# Patient Record
Sex: Female | Born: 1980 | Race: White | Hispanic: No | Marital: Married | Smoking: Former smoker
Health system: Southern US, Community
[De-identification: ages and names within clinical notes are randomized; demographics above are authoritative.]

## PROBLEM LIST (undated history)

## (undated) DIAGNOSIS — R002 Palpitations: Secondary | ICD-10-CM

## (undated) DIAGNOSIS — Z9889 Other specified postprocedural states: Secondary | ICD-10-CM

## (undated) DIAGNOSIS — I10 Essential (primary) hypertension: Secondary | ICD-10-CM

## (undated) DIAGNOSIS — D649 Anemia, unspecified: Secondary | ICD-10-CM

## (undated) DIAGNOSIS — O99210 Obesity complicating pregnancy, unspecified trimester: Secondary | ICD-10-CM

## (undated) HISTORY — PX: LAPAROSCOPIC GASTRIC SLEEVE RESECTION: SHX5895

## (undated) HISTORY — PX: CHOLECYSTECTOMY: SHX55

## (undated) MED FILL — Iron Sucrose Inj 20 MG/ML (Fe Equiv): INTRAVENOUS | Qty: 10 | Status: AC

---

## 2014-06-03 ENCOUNTER — Inpatient Hospital Stay (HOSPITAL_COMMUNITY): Payer: BLUE CROSS/BLUE SHIELD | Admitting: Anesthesiology

## 2014-06-03 ENCOUNTER — Observation Stay (HOSPITAL_COMMUNITY)
Admission: EM | Admit: 2014-06-03 | Discharge: 2014-06-03 | Disposition: A | Discharge status: Home / Self Care | Payer: BLUE CROSS/BLUE SHIELD | Attending: Obstetrics & Gynecology | Admitting: Obstetrics & Gynecology

## 2014-06-03 ENCOUNTER — Inpatient Hospital Stay (HOSPITAL_COMMUNITY): Payer: BLUE CROSS/BLUE SHIELD

## 2014-06-03 ENCOUNTER — Encounter (HOSPITAL_COMMUNITY): Payer: Self-pay

## 2014-06-03 ENCOUNTER — Encounter (HOSPITAL_COMMUNITY)
Admission: EM | Disposition: A | Discharge status: Home / Self Care | Payer: Self-pay | Source: Home / Self Care | Attending: Obstetrics & Gynecology

## 2014-06-03 ENCOUNTER — Encounter: Payer: Self-pay | Admitting: Obstetrics & Gynecology

## 2014-06-03 DIAGNOSIS — Z6841 Body Mass Index (BMI) 40.0 and over, adult: Secondary | ICD-10-CM | POA: Insufficient documentation

## 2014-06-03 DIAGNOSIS — O42912 Preterm premature rupture of membranes, unspecified as to length of time between rupture and onset of labor, second trimester: Secondary | ICD-10-CM

## 2014-06-03 DIAGNOSIS — I10 Essential (primary) hypertension: Secondary | ICD-10-CM | POA: Insufficient documentation

## 2014-06-03 DIAGNOSIS — Z3A17 17 weeks gestation of pregnancy: Secondary | ICD-10-CM | POA: Insufficient documentation

## 2014-06-03 DIAGNOSIS — Z87891 Personal history of nicotine dependence: Secondary | ICD-10-CM | POA: Insufficient documentation

## 2014-06-03 HISTORY — DX: Essential (primary) hypertension: I10

## 2014-06-03 HISTORY — PX: DILATION AND EVACUATION: SHX1459

## 2014-06-03 LAB — CBC
HCT: 39.3 % (ref 36.0–46.0)
Hemoglobin: 14 g/dL (ref 12.0–15.0)
MCH: 30 pg (ref 26.0–34.0)
MCHC: 35.6 g/dL (ref 30.0–36.0)
MCV: 84.3 fL (ref 78.0–100.0)
Platelets: 355 10*3/uL (ref 150–400)
RBC: 4.66 MIL/uL (ref 3.87–5.11)
RDW: 14.2 % (ref 11.5–15.5)
WBC: 14.6 10*3/uL — ABNORMAL HIGH (ref 4.0–10.5)

## 2014-06-03 LAB — CBC WITH DIFFERENTIAL/PLATELET
Basophils Absolute: 0 10*3/uL (ref 0.0–0.1)
Basophils Relative: 0 % (ref 0–1)
Eosinophils Absolute: 0.1 10*3/uL (ref 0.0–0.7)
Eosinophils Relative: 1 % (ref 0–5)
HCT: 37.7 % (ref 36.0–46.0)
Hemoglobin: 13.3 g/dL (ref 12.0–15.0)
Lymphocytes Relative: 17 % (ref 12–46)
Lymphs Abs: 2.1 10*3/uL (ref 0.7–4.0)
MCH: 29.8 pg (ref 26.0–34.0)
MCHC: 35.3 g/dL (ref 30.0–36.0)
MCV: 84.3 fL (ref 78.0–100.0)
Monocytes Absolute: 1.1 10*3/uL — ABNORMAL HIGH (ref 0.1–1.0)
Monocytes Relative: 8 % (ref 3–12)
Neutro Abs: 9.4 10*3/uL — ABNORMAL HIGH (ref 1.7–7.7)
Neutrophils Relative %: 74 % (ref 43–77)
Platelets: 334 10*3/uL (ref 150–400)
RBC: 4.47 MIL/uL (ref 3.87–5.11)
RDW: 14 % (ref 11.5–15.5)
WBC: 12.6 10*3/uL — ABNORMAL HIGH (ref 4.0–10.5)

## 2014-06-03 LAB — BASIC METABOLIC PANEL
Anion gap: 4 — ABNORMAL LOW (ref 5–15)
BUN: <5 mg/dL — ABNORMAL LOW (ref 6–23)
CO2: 26 mmol/L (ref 19–32)
Calcium: 8.8 mg/dL (ref 8.4–10.5)
Chloride: 106 mmol/L (ref 96–112)
Creatinine, Ser: 0.68 mg/dL (ref 0.50–1.10)
GFR calc Af Amer: >90 mL/min (ref >90)
GFR calc non Af Amer: >90 mL/min (ref >90)
Glucose, Bld: 109 mg/dL — ABNORMAL HIGH (ref 70–99)
Potassium: 3.5 mmol/L (ref 3.5–5.1)
Sodium: 136 mmol/L (ref 135–145)

## 2014-06-03 LAB — TYPE AND SCREEN
ABO/RH(D): B POS
Antibody Screen: NEGATIVE

## 2014-06-03 LAB — HCG, QUANTITATIVE, PREGNANCY: hCG, Beta Chain, Quant, S: 23995 m[IU]/mL — ABNORMAL HIGH (ref <5)

## 2014-06-03 LAB — MRSA PCR SCREENING: MRSA by PCR: NEGATIVE

## 2014-06-03 LAB — ABO/RH: ABO/RH(D): B POS

## 2014-06-03 SURGERY — DILATION AND EVACUATION, UTERUS
Anesthesia: Monitor Anesthesia Care | Site: Vagina

## 2014-06-03 MED ORDER — FAMOTIDINE IN NACL 20-0.9 MG/50ML-% IV SOLN
20.0000 mg | Freq: Once | INTRAVENOUS | Status: AC
  Administered 2014-06-03: 20 mg via INTRAVENOUS
  Filled 2014-06-03: qty 50

## 2014-06-03 MED ORDER — IBUPROFEN 600 MG PO TABS: 600.0000 mg | ORAL_TABLET | Freq: Four times a day (QID) | ORAL | Status: DC | PRN

## 2014-06-03 MED ORDER — LACTATED RINGERS IV SOLN
INTRAVENOUS | Status: DC
  Administered 2014-06-03 (×3): via INTRAVENOUS

## 2014-06-03 MED ORDER — KETOROLAC TROMETHAMINE 30 MG/ML IJ SOLN
INTRAMUSCULAR | Status: AC
  Filled 2014-06-03: qty 1

## 2014-06-03 MED ORDER — FENTANYL CITRATE 0.05 MG/ML IJ SOLN
INTRAMUSCULAR | Status: AC
  Filled 2014-06-03: qty 5

## 2014-06-03 MED ORDER — MIDAZOLAM HCL 2 MG/2ML IJ SOLN
INTRAMUSCULAR | Status: AC
  Filled 2014-06-03: qty 2

## 2014-06-03 MED ORDER — OXYCODONE-ACETAMINOPHEN 5-325 MG PO TABS: 1.0000 | ORAL_TABLET | ORAL | Status: DC | PRN

## 2014-06-03 MED ORDER — DEXAMETHASONE SODIUM PHOSPHATE 4 MG/ML IJ SOLN
INTRAMUSCULAR | Status: AC
Start: 2014-06-03 — End: 2014-06-03
  Filled 2014-06-03: qty 1

## 2014-06-03 MED ORDER — LIDOCAINE HCL (CARDIAC) 20 MG/ML IV SOLN
INTRAVENOUS | Status: DC | PRN
  Administered 2014-06-03: 50 mg via INTRAVENOUS

## 2014-06-03 MED ORDER — CITRIC ACID-SODIUM CITRATE 334-500 MG/5ML PO SOLN
30.0000 mL | Freq: Once | ORAL | Status: AC
  Administered 2014-06-03: 30 mL via ORAL
  Filled 2014-06-03: qty 30

## 2014-06-03 MED ORDER — MIDAZOLAM HCL 2 MG/2ML IJ SOLN
INTRAMUSCULAR | Status: DC | PRN
  Administered 2014-06-03: 2 mg via INTRAVENOUS

## 2014-06-03 MED ORDER — ONDANSETRON HCL 4 MG PO TABS: 4.0000 mg | ORAL_TABLET | Freq: Four times a day (QID) | ORAL | Status: DC | PRN

## 2014-06-03 MED ORDER — PROPOFOL 10 MG/ML IV BOLUS
INTRAVENOUS | Status: AC
  Filled 2014-06-03: qty 20

## 2014-06-03 MED ORDER — LIDOCAINE HCL (CARDIAC) 20 MG/ML IV SOLN
INTRAVENOUS | Status: AC
  Filled 2014-06-03: qty 5

## 2014-06-03 MED ORDER — ACETAMINOPHEN 500 MG PO TABS: 1000.0000 mg | ORAL_TABLET | Freq: Four times a day (QID) | ORAL | Status: DC | PRN

## 2014-06-03 MED ORDER — KETOROLAC TROMETHAMINE 30 MG/ML IJ SOLN
INTRAMUSCULAR | Status: DC | PRN
  Administered 2014-06-03: 30 mg via INTRAVENOUS

## 2014-06-03 MED ORDER — HYDROMORPHONE HCL 1 MG/ML IJ SOLN: 0.2000 mg | INTRAMUSCULAR | Status: DC | PRN

## 2014-06-03 MED ORDER — DOCUSATE SODIUM 100 MG PO CAPS: 100.0000 mg | ORAL_CAPSULE | Freq: Two times a day (BID) | ORAL | Status: DC | PRN

## 2014-06-03 MED ORDER — MISOPROSTOL 200 MCG PO TABS
ORAL_TABLET | ORAL | Status: AC
  Filled 2014-06-03: qty 3

## 2014-06-03 MED ORDER — ONDANSETRON HCL 4 MG/2ML IJ SOLN: 4.0000 mg | Freq: Four times a day (QID) | INTRAMUSCULAR | Status: DC | PRN

## 2014-06-03 MED ORDER — FENTANYL CITRATE 0.05 MG/ML IJ SOLN: 25.0000 ug | INTRAMUSCULAR | Status: DC | PRN

## 2014-06-03 MED ORDER — PROPOFOL 10 MG/ML IV BOLUS
INTRAVENOUS | Status: DC | PRN
  Administered 2014-06-03: 200 mg via INTRAVENOUS

## 2014-06-03 MED ORDER — ONDANSETRON HCL 4 MG/2ML IJ SOLN
INTRAMUSCULAR | Status: AC
  Filled 2014-06-03: qty 2

## 2014-06-03 MED ORDER — BUPIVACAINE HCL 0.5 % IJ SOLN
INTRAMUSCULAR | Status: DC | PRN
  Administered 2014-06-03: 30 mL

## 2014-06-03 MED ORDER — BUPIVACAINE HCL (PF) 0.5 % IJ SOLN
INTRAMUSCULAR | Status: AC
  Filled 2014-06-03: qty 30

## 2014-06-03 MED ORDER — DEXAMETHASONE SODIUM PHOSPHATE 10 MG/ML IJ SOLN
INTRAMUSCULAR | Status: DC | PRN
  Administered 2014-06-03: 10 mg via INTRAVENOUS

## 2014-06-03 MED ORDER — DOXYCYCLINE HYCLATE 100 MG IV SOLR
200.0000 mg | INTRAVENOUS | Status: AC
  Administered 2014-06-03: 200 mg via INTRAVENOUS
  Filled 2014-06-03: qty 200

## 2014-06-03 MED ORDER — FENTANYL CITRATE 0.05 MG/ML IJ SOLN
INTRAMUSCULAR | Status: DC | PRN
  Administered 2014-06-03: 100 ug via INTRAVENOUS

## 2014-06-03 MED ORDER — ONDANSETRON HCL 4 MG/2ML IJ SOLN
INTRAMUSCULAR | Status: DC | PRN
  Administered 2014-06-03: 4 mg via INTRAVENOUS

## 2014-06-03 SURGICAL SUPPLY — 19 items
CATH ROBINSON RED A/P 16FR (CATHETERS) ×2 IMPLANT
CLOTH BEACON ORANGE TIMEOUT ST (SAFETY) ×2 IMPLANT
DECANTER SPIKE VIAL GLASS SM (MISCELLANEOUS) ×2 IMPLANT
GLOVE BIOGEL PI IND STRL 7.0 (GLOVE) ×2 IMPLANT
GLOVE BIOGEL PI INDICATOR 7.0 (GLOVE) ×2
GLOVE ECLIPSE 7.0 STRL STRAW (GLOVE) ×2 IMPLANT
GOWN STRL REUS W/TWL LRG LVL3 (GOWN DISPOSABLE) ×4 IMPLANT
KIT BERKELEY 1ST TRIMESTER 3/8 (MISCELLANEOUS) ×2 IMPLANT
NS IRRIG 1000ML POUR BTL (IV SOLUTION) ×2 IMPLANT
PACK VAGINAL MINOR WOMEN LF (CUSTOM PROCEDURE TRAY) ×2 IMPLANT
PAD OB MATERNITY 4.3X12.25 (PERSONAL CARE ITEMS) ×2 IMPLANT
PAD PREP 24X48 CUFFED NSTRL (MISCELLANEOUS) ×2 IMPLANT
SET BERKELEY SUCTION TUBING (SUCTIONS) ×2 IMPLANT
TOWEL OR 17X24 6PK STRL BLUE (TOWEL DISPOSABLE) ×4 IMPLANT
VACURETTE 10 RIGID CVD (CANNULA) IMPLANT
VACURETTE 6 ASPIR F TIP BERK (CANNULA) IMPLANT
VACURETTE 7MM CVD STRL WRAP (CANNULA) IMPLANT
VACURETTE 8 RIGID CVD (CANNULA) IMPLANT
VACURETTE 9 RIGID CVD (CANNULA) IMPLANT

## 2014-06-03 NOTE — Interval H&P Note (Signed)
History and Physical Interval Note  Patient has now decided to proceed with Dilation and Evacuation (D&E) for retained placenta; to be done under ultrasound guidance.  No bleeding noted. Still afebrile, other VSS.   Risks of surgery including bleeding, infection, injury to surrounding organs, need for additional procedures, possibility of intrauterine scarring which may impair future fertility, risk of retained products which may require further management and other postoperative/anesthesia complications were explained to patient.  Likelihood of success of complete evacuation of the uterus was discussed with the patient.  Written informed consent was obtained.  Patient has been NPO since last night  she will remain NPO for procedure. Anesthesia and OR aware.  Preoperative prophylactic Doxycycline 200mg  IV  has been ordered and is on call to the OR.  To OR when ready.    Amber CollinsUGONNA  Amber Riveron, MD, FACOG Attending Obstetrician & Gynecologist Faculty Practice, [facility] [place]

## 2014-06-03 NOTE — Anesthesia Preprocedure Evaluation (Signed)
Anesthesia Evaluation  Patient identified by MRN, date of birth, ID band Patient awake    Reviewed: Allergy & Precautions, H&P , Patient's Chart, lab work & pertinent test results, reviewed documented beta blocker date and time   Airway Mallampati: II  TM Distance: >3 FB Neck ROM: full    Dental no notable dental hx.    Pulmonary former smoker,  breath sounds clear to auscultation  Pulmonary exam normal       Cardiovascular hypertension, On Medications Rhythm:regular Rate:Normal     Neuro/Psych    GI/Hepatic   Endo/Other  Morbid obesity  Renal/GU      Musculoskeletal   Abdominal   Peds  Hematology   Anesthesia Other Findings   Reproductive/Obstetrics                             Anesthesia Physical Anesthesia Plan  ASA: III  Anesthesia Plan:    Post-op Pain Management:    Induction: Intravenous  Airway Management Planned: LMA  Additional Equipment:   Intra-op Plan:   Post-operative Plan:   Informed Consent: I have reviewed the patients History and Physical, chart, labs and discussed the procedure including the risks, benefits and alternatives for the proposed anesthesia with the patient or authorized representative who has indicated his/her understanding and acceptance.   Dental Advisory Given and Dental advisory given  Plan Discussed with: CRNA and Surgeon  Anesthesia Plan Comments: (Discussed GA with LMA, possible sore throat, potential need to switch to ETT, N/V, pulmonary aspiration. Questions answered. )        Anesthesia Quick Evaluation

## 2014-06-03 NOTE — MAU Provider Note (Signed)
OB/GYN Attending MAU Note  Chief Complaint: Retained placenta s/p 17 week delivery  SUBJECTIVE HPI: Amber Hunt is a 34 y.o. [id number] s/p SVD of 17 week previable fetus earlier today at [facility] ER with retained placenta.  As per patient and previous notes:   Patient went to [facility] last Saturday (05/27/14) because her water broke, d/c home Tuesday (05/30/14) with normal pregnancy at 16 weeks. Pain free since then until this morning when she felt lower abdominal pressure at 4 AM this morning while urinating. She reports that she then passed the fetus. She has the fetus with her at this time on a towel with the umbilical cord still attached upon arrival to the [facility] ER via EMS. Dr. Elesa Massed at bedside to help patient deliver the placenta with gentle massage but unsuccessful and the umbilical cord broke during this attempt. No bleeding noted.  VSS, Hgb 12.6. Of note, patient is a patient at [facility], was transferred from [facility] in [place] for anhydramnios at [redacted] weeks GA.   Given the retained placenta after delivery, patient was transferred to [facility] for further management.    Past Medical History  Diagnosis Date  . Hypertension    OB History  Gravida Para Term Preterm AB SAB TAB Ectopic Multiple Living  0 1 1 0 0 0 2    # Outcome Date GA Lbr Len/2nd Weight Sex Delivery Anes PTL Lv  3 SAB 06/03/14 [redacted]w[redacted]d   U  None Y FD     Complications: Premature rupture of membranes in second trimester  2 Term 2010     Vag-Spont     1 Term 2004     Vag-Spont        Past Surgical History  Procedure Laterality Date  . Cholecystectomy     History   Social History  . Marital Status: Married    Spouse Name: N/A  . Number of Children: N/A  . Years of Education: N/A   Occupational History  . Not on file.   Social History Main Topics  . Smoking status: Former Smoker    Quit date: 06/03/2006  . Smokeless tobacco: Not on file  . Alcohol Use:  No  . Drug Use: Not on file  . Sexual Activity: Not on file   Other Topics Concern  . Not on file   Social History Narrative  . No narrative on file   No current facility-administered medications on file prior to encounter.   No current outpatient prescriptions on file prior to encounter.   No Known Allergies  ROS: Pertinent items in HPI  OBJECTIVE Blood pressure 142/87, pulse 102, temperature 98.3 F (36.8 C), temperature source Oral, resp. rate 17, height  (1.651 m), weight 290 lb (131.543 kg), last menstrual period 01/02/2014, SpO2 98 %, unknown if currently breastfeeding. GENERAL: Well-developed, well-nourished female in no acute distress.  HEENT: Normocephalic, atraumatic HEART: Regular rate and rhythm RESP: Normal effort, no difficult breathing  ABDOMEN: Soft, obese, non-tender, non-distended EXTREMITIES: Nontender, no edema NEURO: Alert and oriented PELVIC: 1.5/80/no placenta palpated.  Minimal blood in vaginal vault. Fundus firm.  LAB RESULTS Results for orders placed or performed during the hospital encounter of 06/03/14 (from the past 24 hour(s))  hCG, quantitative, pregnancy     Status: Abnormal   Collection Time: 06/03/14  6:16 AM  Result Value Ref Range   hCG, Beta Chain, Quant, S 45409 (H) <5 mIU/mL  CBC with Differential/Platelet  Status: Abnormal   Collection Time: 06/03/14  6:20 AM  Result Value Ref Range   WBC 12.6 (H) 4.0 - 10.5 K/uL   RBC 4.47 3.87 - 5.11 MIL/uL   Hemoglobin 13.3 12.0 - 15.0 g/dL   HCT 16.137.7 09.636.0 - 04.546.0 %   MCV 84.3 78.0 - 100.0 fL   MCH 29.8 26.0 - 34.0 pg   MCHC 35.3 30.0 - 36.0 g/dL   RDW 40.914.0 81.111.5 - 91.415.5 %   Platelets 334 150 - 400 K/uL   Neutrophils Relative % 74 43 - 77 %   Neutro Abs 9.4 (H) 1.7 - 7.7 K/uL   Lymphocytes Relative 17 12 - 46 %   Lymphs Abs 2.1 0.7 - 4.0 K/uL   Monocytes Relative 8 3 - 12 %   Monocytes Absolute 1.1 (H) 0.1 - 1.0 K/uL   Eosinophils Relative 1 0 - 5 %   Eosinophils Absolute 0.1 0.0 -  0.7 K/uL   Basophils Relative 0 0 - 1 %   Basophils Absolute 0.0 0.0 - 0.1 K/uL  Basic metabolic panel     Status: Abnormal   Collection Time: 06/03/14  6:20 AM  Result Value Ref Range   Sodium 136 135 - 145 mmol/L   Potassium 3.5 3.5 - 5.1 mmol/L   Chloride 106 96 - 112 mmol/L   CO2 26 19 - 32 mmol/L   Glucose, Bld 109 (H) 70 - 99 mg/dL   BUN <5 (L) 6 - 23 mg/dL   Creatinine, Ser 7.820.68 0.50 - 1.10 mg/dL   Calcium 8.8 8.4 - 95.610.5 mg/dL   GFR calc non Af Amer >90 >90 mL/min   GFR calc Af Amer >90 >90 mL/min   Anion gap 4 (L) 5 - 15  CBC     Status: Abnormal   Collection Time: 06/03/14 10:00 AM  Result Value Ref Range   WBC 14.6 (H) 4.0 - 10.5 K/uL   RBC 4.66 3.87 - 5.11 MIL/uL   Hemoglobin 14.0 12.0 - 15.0 g/dL   HCT 21.339.3 08.636.0 - 57.846.0 %   MCV 84.3 78.0 - 100.0 fL   MCH 30.0 26.0 - 34.0 pg   MCHC 35.6 30.0 - 36.0 g/dL   RDW 46.914.2 62.911.5 - 52.815.5 %   Platelets 355 150 - 400 K/uL    IMAGING No results found.  MAU COURSE CBC and T&S checked  ASSESSMENT Retained placenta s/p delivery at 17 weeks  PLAN Patient was given options of expectant management, misoprostol administration vs D&E, risks/benefits of all modalities reviewed.  Patient opted for expectant management for now.  [facility] and observe her there.  She was told that if she developed any heavy bleeding, signs of infection, or other concerning signs/symptoms further intervention will be needed.  She was told that she will be given until tonight to deliver placenta; if this does not happen, D&E or misoprostol administration will be strongly recommended.   Jaynie CollinsUGONNA  Joleen Stuckert, MD, FACOG Attending Obstetrician & Gynecologist Faculty Practice, [facility] [place]

## 2014-06-03 NOTE — ED Provider Notes (Signed)
CSN: [id number]     Arrival date & time 06/03/14  0541 History   First MD Initiated Contact with Patient 06/03/14 0600     Chief Complaint  Patient presents with  . Irregular Heart Beat  . Miscarriage     (Consider location/radiation/quality/duration/timing/severity/associated sxs/prior Treatment) HPI Comments: Patient who is currently [redacted] weeks pregnant presents today with a chief complaint of miscarriage.  She ws brought into the ED by EMS.  EMS report that en route she went into SVT, which resolved with vagal maneuvers.  Patient reports that one week ago her water broke.  [facility] and admitted for 2 days.  She reports that [facility] was then [facility] and was followed by the [facility].  She states that she was then discharged home.  This morning she felt lower abdominal pressure at 4 AM this morning while urinating.  She reports that she then passed the fetus.  She has the fetus with her at this time on a towel with the umbilical cord still attached.  She did not yet deliver the placenta.  She denies any vaginal bleeding or cramping at this time.  She denies any contractions at this time.  She reports that she did not have any complications with her pregnancy prior to one week ago.  This is her third pregnancy.  She reports that her blood type is B+.  The history is provided by the patient.    Past Medical History  Diagnosis Date  . Hypertension    Past Surgical History  Procedure Laterality Date  . Cholecystectomy     No family history on file. History  Substance Use Topics  . Smoking status: Former Smoker    Quit date: 06/03/2006  . Smokeless tobacco: Not on file  . Alcohol Use: No   OB History    Gravida Para Term Preterm AB TAB SAB Ectopic Multiple Living   1              Review of Systems  All other systems reviewed and are negative.     Allergies  Review of patient's allergies indicates not  on file.  Home Medications   Prior to Admission medications   Not on File   BP 115/70 mmHg  Pulse 108  Temp(Src) 99 F (37.2 C) (Oral)  Resp 14  Ht 5\' 5"  (1.651 m)  Wt 290 lb (131.543 kg)  BMI 48.26 kg/m2  SpO2 97%  LMP 01/02/2014 Physical Exam  Constitutional: She appears well-developed and well-nourished.  HENT:  Head: Normocephalic and atraumatic.  Mouth/Throat: Oropharynx is clear and moist.  Neck: Normal range of motion. Neck supple.  Cardiovascular: Regular rhythm and normal heart sounds.  Tachycardia present.   Pulmonary/Chest: Effort normal and breath sounds normal.  Abdominal: Soft. Bowel sounds are normal. She exhibits no distension and no mass. There is no tenderness. There is no rebound and no guarding.  Genitourinary:  Patient with deceased fetus on a towel in between her legs with the umbilical cord attached.  Placenta retained in the uterus.  Musculoskeletal: Normal range of motion.  Neurological: She is alert.  Skin: Skin is warm and dry.  Psychiatric: She has a normal mood and affect.  Nursing note and vitals reviewed.   ED Course  Procedures (including critical care time) Labs Review Labs Reviewed  CBC WITH DIFFERENTIAL/PLATELET - Abnormal; Notable for the following:    WBC 12.6 (*)    Neutro  Abs 9.4 (*)    Monocytes Absolute 1.1 (*)    All other components within normal limits  BASIC METABOLIC PANEL  HCG, QUANTITATIVE, PREGNANCY    Imaging Review No results found.   EKG Interpretation   Date/Time:  Saturday June 03 2014 05:58:48 EDT Ventricular Rate:  114 PR Interval:  143 QRS Duration: 86 QT Interval:  330 QTC Calculation: 454 R Axis:   33 Text Interpretation:  Sinus tachycardia Low voltage, precordial leads  Confirmed by WARD,  DO, KRISTEN ([id number]) on 06/03/2014 6:00:24 AM     7:30 AM Patient discussed with OB/GYN.  He recommends sending the fetus for pathology and also transferring the patient to [facility].   MDM   Final  diagnoses:  None   Patient presents today after a spontaneous abortion that occurred at home just prior to arrival.  Patient comes into the ED with the deceased fetus attached to the umbilical cord and the placenta retained in the uterus.  Dr. Elesa Massed attempted to deliver the placenta with gentle massage.  However, during this process the umbilical cord became detached from the placenta.  On call OB/GYN was consulted and recommended transfer to [facility].  Patient stable at time of transfer.       Santiago Glad, PA-C 06/04/14 0701  Layla Maw Ward, DO 06/04/14 [id number]

## 2014-06-03 NOTE — Discharge Summary (Signed)
Gynecology Physician Postoperative Discharge Summary  Patient ID: Amber Hunt MRN: [id number] DOB/AGE: 1980/09/24 34 y.o.  Admit Date: 06/03/2014 Discharge Date: 06/03/2014  Preoperative Diagnoses: Retained placenta after delivery at 17 weeks  Procedures: DILATATION AND EVACUATION under ultrasound guidance  CBC Latest Ref Rng 06/03/2014 06/03/2014  WBC 4.0 - 10.5 K/uL 14.6(H) 12.6(H)  Hemoglobin 12.0 - 15.0 g/dL 82.914.0 56.213.3  Hematocrit 13.036.0 - 46.0 % 39.3 37.7  Platelets 150 - 400 K/uL 355 334   --/--/B POS, B POS (04/02 1000)  Hospital Course:  Amber Hunt is a 34 y.o. [phone] with retained placenta after home delivery of 17 week fetus; initially admitted for observation and expectant management. Please see H&P for more details.  Patient then decided to proceed with D&E.   She underwent the procedure as mentioned above, her operation was uncomplicated. For further details about surgery, please refer to the operative report. Patient had an uncomplicated postoperative course and was deemed stable for discharge to home from PACU.  She will follow up in clinic in 2-3 weeks.   Discharge Exam: Blood pressure 148/93, pulse 101, temperature 99.3 F (37.4 C), temperature source Oral, resp. rate 19, height 5\' 5"  (1.651 m), weight 290 lb (131.543 kg), last menstrual period 01/02/2014, SpO2 100 %, unknown if currently breastfeeding. General appearance: alert and no distress  Resp: clear to auscultation bilaterally  Cardio: regular rate and rhythm  GI: soft, non-tender; bowel sounds normal; obese, no masses, no organomegaly.  Pelvic: scant blood on pad  Extremities: extremities normal, atraumatic, no cyanosis or edema and Homans sign is negative, no sign of DVT  Discharged Condition: Stable  Disposition: Home    Medication List    TAKE these medications        amoxicillin 250 MG capsule  Commonly known as:  AMOXIL  Take 250 mg by mouth every 6 (six) hours. For 3 days     docusate sodium 100  MG capsule  Commonly known as:  COLACE  Take 1 capsule (100 mg total) by mouth 2 (two) times daily as needed.     ibuprofen 600 MG tablet  Commonly known as:  ADVIL,MOTRIN  Take 1 tablet (600 mg total) by mouth every 6 (six) hours as needed.     methyldopa 250 MG tablet  Commonly known as:  ALDOMET  Take 250 mg by mouth 2 (two) times daily.     oxyCODONE-acetaminophen 5-325 MG per tablet  Commonly known as:  PERCOCET/ROXICET  Take 1-2 tablets by mouth every 3 (three) hours as needed for severe pain (moderate to severe pain (when tolerating fluids)).           Follow-up Information    Follow up with Tereso NewcomerANYANWU,UGONNA A, MD.   Specialty:  Obstetrics and Gynecology   Why:  You will be called with appointment time and date. Call clinic/come to MAU for any concerning issues   Contact information:   [address] [place] [address] [phone]       Signed:  Jaynie CollinsUGONNA  ANYANWU, MD, FACOG Attending Obstetrician & Gynecologist Faculty Practice, [facility] 

## 2014-06-03 NOTE — Anesthesia Postprocedure Evaluation (Signed)
  Anesthesia Post-op Note  Patient: Amber Hunt  Procedure(s) Performed: Procedure(s): DILATATION AND EVACUATION (N/A) Patient is awake and responsive. Pain and nausea are reasonably well controlled. Vital signs are stable and clinically acceptable. Oxygen saturation is clinically acceptable. There are no apparent anesthetic complications at this time. Patient is ready for discharge.

## 2014-06-03 NOTE — H&P (Signed)
OB/GYN Attending H&P Note  Chief Complaint: Retained placenta s/p 17 week delivery  SUBJECTIVE HPI: Amber Hunt is a 34 y.o. [id number] s/p SVD of 17 week previable fetus earlier [facility] with retained placenta.  As per patient and previous notes:   [facility] (05/27/14) because her water broke, d/c home Tuesday (05/30/14) with normal pregnancy at 16 weeks. Pain free since then until this morning when she felt lower abdominal pressure at 4 AM this morning while urinating. She reports that she then passed the fetus. She has the fetus with her at this time on a towel with the umbilical cord still attached upon [facility]. Dr. Elesa MassedWard at bedside to help patient deliver the placenta with gentle massage but unsuccessful and the umbilical cord broke during this attempt. No bleeding noted.  VSS, Hgb 12.6. Of note, patient is a [facility], [facility] [place] for anhydramnios at [redacted] weeks GA.   Given the retained placenta after delivery, [facility].    Past Medical History  Diagnosis Date  . Hypertension    OB History  Gravida Para Term Preterm AB SAB TAB Ectopic Multiple Living  3 2 2  0 1 1 0 0 0 2    # Outcome Date GA Lbr Len/2nd Weight Sex Delivery Anes PTL Lv  3 SAB 06/03/14 [id number]   U  None Y FD     Complications: Premature rupture of membranes in second trimester  2 Term 2010     Vag-Spont     1 Term 2004     Vag-Spont        Past Surgical History  Procedure Laterality Date  . Cholecystectomy     History   Social History  . Marital Status: Married    Spouse Name: N/A  . Number of Children: N/A  . Years of Education: N/A   Occupational History  . Not on file.   Social History Main Topics  . Smoking status: Former Smoker    Quit date: 06/03/2006  . Smokeless tobacco: Not on file  . Alcohol Use:  No  . Drug Use: Not on file  . Sexual Activity: Not on file   Other Topics Concern  . Not on file   Social History Narrative  . No narrative on file   No current facility-administered medications on file prior to encounter.   No current outpatient prescriptions on file prior to encounter.   No Known Allergies  ROS: Pertinent items in HPI  OBJECTIVE Blood pressure 142/87, pulse 102, temperature 98.3 F (36.8 C), temperature source Oral, resp. rate 17, height 5\' 5"  (1.651 m), weight 290 lb (131.543 kg), last menstrual period 01/02/2014, SpO2 98 %, unknown if currently breastfeeding. GENERAL: Well-developed, well-nourished female in no acute distress.  HEENT: Normocephalic, atraumatic HEART: Regular rate and rhythm RESP: Normal effort, no difficult breathing  ABDOMEN: Soft, obese, non-tender, non-distended EXTREMITIES: Nontender, no edema NEURO: Alert and oriented PELVIC: 1.5/80/no placenta palpated.  Minimal blood in vaginal vault. Fundus firm.  LAB RESULTS Results for orders placed or performed during the hospital encounter of 06/03/14 (from the past 24 hour(s))  hCG, quantitative, pregnancy     Status: Abnormal   Collection Time: 06/03/14  6:16 AM  Result Value Ref Range   hCG, Beta Chain, Quant, S [id number] (H) <5 mIU/mL  CBC with Differential/Platelet  Status: Abnormal   Collection Time: 06/03/14  6:20 AM  Result Value Ref Range   WBC 12.6 (H) 4.0 - 10.5 K/uL   RBC 4.47 3.87 - 5.11 MIL/uL   Hemoglobin 13.3 12.0 - 15.0 g/dL   HCT 78.2 95.6 - 21.3 %   MCV 84.3 78.0 - 100.0 fL   MCH 29.8 26.0 - 34.0 pg   MCHC 35.3 30.0 - 36.0 g/dL   RDW 08.6 57.8 - 46.9 %   Platelets 334 150 - 400 K/uL   Neutrophils Relative % 74 43 - 77 %   Neutro Abs 9.4 (H) 1.7 - 7.7 K/uL   Lymphocytes Relative 17 12 - 46 %   Lymphs Abs 2.1 0.7 - 4.0 K/uL   Monocytes Relative 8 3 - 12 %   Monocytes Absolute 1.1 (H) 0.1 - 1.0 K/uL   Eosinophils Relative 1 0 - 5 %   Eosinophils Absolute 0.1 0.0 -  0.7 K/uL   Basophils Relative 0 0 - 1 %   Basophils Absolute 0.0 0.0 - 0.1 K/uL  Basic metabolic panel     Status: Abnormal   Collection Time: 06/03/14  6:20 AM  Result Value Ref Range   Sodium 136 135 - 145 mmol/L   Potassium 3.5 3.5 - 5.1 mmol/L   Chloride 106 96 - 112 mmol/L   CO2 26 19 - 32 mmol/L   Glucose, Bld 109 (H) 70 - 99 mg/dL   BUN <5 (L) 6 - 23 mg/dL   Creatinine, Ser 6.29 0.50 - 1.10 mg/dL   Calcium 8.8 8.4 - 52.8 mg/dL   GFR calc non Af Amer >90 >90 mL/min   GFR calc Af Amer >90 >90 mL/min   Anion gap 4 (L) 5 - 15  CBC     Status: Abnormal   Collection Time: 06/03/14 10:00 AM  Result Value Ref Range   WBC 14.6 (H) 4.0 - 10.5 K/uL   RBC 4.66 3.87 - 5.11 MIL/uL   Hemoglobin 14.0 12.0 - 15.0 g/dL   HCT 41.3 24.4 - 01.0 %   MCV 84.3 78.0 - 100.0 fL   MCH 30.0 26.0 - 34.0 pg   MCHC 35.6 30.0 - 36.0 g/dL   RDW 27.2 53.6 - 64.4 %   Platelets 355 150 - 400 K/uL  Type and screen     Status: None (Preliminary result)   Collection Time: 06/03/14 10:00 AM  Result Value Ref Range   ABO/RH(D) B POS    Antibody Screen PENDING    Sample Expiration 06/06/2014      IMAGING No results found.  ASSESSMENT Retained placenta s/p delivery at 17 weeks  PLAN Patient was given options of expectant management, misoprostol administration vs D&E, risks/benefits of all modalities reviewed.  Patient opted for expectant management for now.  Will admit to [facility] and observe her there.  She was told that if she developed any heavy bleeding, signs of infection, or other concerning signs/symptoms further intervention will be needed.  She was told that she will be given until tonight to deliver placenta; if this does not happen, D&E or misoprostol administration will be strongly recommended.   Jaynie Collins, MD, FACOG Attending Obstetrician & Gynecologist Faculty Practice, [facility]

## 2014-06-03 NOTE — Transfer of Care (Signed)
Immediate Anesthesia Transfer of Care Note  Patient: Amber Hunt  Procedure(s) Performed: Procedure(s): DILATATION AND EVACUATION (N/A)  Patient Location: PACU  Anesthesia Type:General  Level of Consciousness: awake  Airway & Oxygen Therapy: Patient Spontanous Breathing  Post-op Assessment: Report given to PACU RN  Post vital signs: stable  Filed Vitals:   06/03/14 1430  BP:   Pulse:   Temp: 37.1 C  Resp:     Complications: No apparent anesthesia complications

## 2014-06-03 NOTE — ED Notes (Signed)
Pelvic cart at bedside. 

## 2014-06-03 NOTE — ED Notes (Signed)
Correction to previous triage note: Pt had not delivered the placenta yet. It appears she had delivered the baby with the umbilical cord still attached. Heather PA and Dr. Elesa MassedWard at bedside to help patient deliver the placenta with gentle massage but unsuccessful. Pt pain free, NAD, VSS.

## 2014-06-03 NOTE — Discharge Instructions (Signed)
Please consider Heartstrings for support   Dilation and Curettage or Vacuum Curettage, Care After Refer to this sheet in the next few weeks. These instructions provide you with information on caring for yourself after your procedure. Your health care provider may also give you more specific instructions. Your treatment has been planned according to current medical practices, but problems sometimes occur. Call your health care provider if you have any problems or questions after your procedure. WHAT TO EXPECT AFTER THE PROCEDURE After your procedure, it is typical to have light cramping and bleeding. This may last for 2 days to 2 weeks after the procedure. HOME CARE INSTRUCTIONS   Do not drive for 24 hours.  Wait 1 week before returning to strenuous activities.  Avoid long periods of standing.  Avoid heavy lifting, pushing, or pulling. Do not lift anything heavier than 10 pounds (4.5 kg).  Limit stair climbing to once or twice a day.  Take rest periods often.  You may resume your usual diet.  Drink enough fluids to keep your urine clear or pale yellow.  Your usual bowel function should return. If you have constipation, you may:  Take a mild laxative with permission from your health care provider.  Add fruit and bran to your diet.  Drink more fluids.  Take showers instead of baths until your health care provider gives you permission to take baths.  Do not go swimming or use a hot tub until your health care provider approves.  Try to have someone with you or available to you the first 24-48 hours, especially if you were given a general anesthetic.  Do not douche, use tampons, or have sex (intercourse) for 2 weeks after the procedure.  Only take over-the-counter or prescription medicines as directed by your health care provider. Do not take aspirin. It can cause bleeding.  Follow up with your health care provider as directed. SEEK MEDICAL CARE IF:   You have increasing  cramps or pain that is not relieved with medicine.  You have abdominal pain that does not seem to be related to the same area of earlier cramping and pain.  You have bad smelling vaginal discharge.  You have a rash.  You are having problems with any medicine. SEEK IMMEDIATE MEDICAL CARE IF:   You have bleeding that is heavier than a normal menstrual period.  You have a fever.  You have chest pain.  You have shortness of breath.  You feel dizzy or feel like fainting.  You pass out.  You have pain in your shoulder strap area.  You have heavy vaginal bleeding with or without blood clots. MAKE SURE YOU:   Understand these instructions.  Will watch your condition.  Will get help right away if you are not doing well or get worse. Document Released: 02/15/2000 Document Revised: 02/22/2013 Document Reviewed: 09/16/2012 [facility] Patient Information 2015 [employer] [place]. This information is not intended to replace advice given to you by your health care provider. Make sure you discuss any questions you have with your health care provider.

## 2014-06-03 NOTE — Op Note (Signed)
Amber Hunt PROCEDURE DATE:  06/03/2014  PREOPERATIVE DIAGNOSIS: Retained placenta after 17 week delivery POSTOPERATIVE DIAGNOSIS: The same PROCEDURE:   Dilation and Curettage under ultrasound guidance SURGEON:  Dr. Jaynie CollinsUgonna Krayton Wortley  INDICATIONS: 34 y.o.  [id number] with retained placenta and hemorrhage after SVD at [redacted] weeks GA needing emergent surgical completion.  Risks of surgery were discussed with the patient and her friend including but not limited to: bleeding which may require transfusion; infection which may require antibiotics; injury to uterus or surrounding organs; need for additional procedures including laparotomy or laparoscopy; possibility of intrauterine scarring which may impair future fertility; and other postoperative/anesthesia complications. Written informed consent was obtained.  FINDINGS:  Fundal placenta with moderate amount of clots and blood.    ANESTHESIA:    General - LMA, paracervical block with 30 ml of 0.5% Marcaine INTRAVENOUS FLUIDS:  1000 ml of LR ESTIMATED BLOOD LOSS:  300 ml SPECIMENS:  Placental fragments sent to pathology COMPLICATIONS:  None immediate.  PROCEDURE DETAILS:  The patient was then taken to the operating room where general anesthesia was administered and was found to be adequate.  After an adequate timeout was performed, she was placed in the dorsal lithotomy position and examined; then prepped and draped in the sterile manner.   A vaginal speculum was then placed in the patient's vagina and a ring forceps was applied to the anterior lip of the cervix.  A paracervical block using 30 ml of 0.5% Marcaine was administered. The cervix was already about 1.5 cm dilated; a 16 mm suction curette was advanced into the uterus under ultrasound guidance, the suction device was then activated and curette slowly rotated to remove the placenta.  Curettage was then performed to confirm to remove remaining fragments. There was significant bleeding noted so PR  Misoprostol 600 mcg was administered.   The bleeding was noted to subside significantly.   All instruments were removed from the patient's vagina.  Sponge and instrument counts were correct times two.  The patient tolerated the procedure well and was taken to the recovery area awake, extubated and in stable condition.  The patient will be discharged to home as per PACU criteria.  Routine postoperative instructions given.  She was prescribed Percocet, Ibuprofen and Colace.  She will follow up in the clinic in 2-3 weeks for postoperative evaluation.    Jaynie CollinsUGONNA  Amber Batley, MD, FACOG Attending Obstetrician & Gynecologist Faculty Practice, [facility]

## 2014-06-03 NOTE — ED Notes (Addendum)
PER EMS: pt from home, [facility] water [facility], d/c home Tuesday with normal pregnancy at 17 weeks. Pain free since then until tonight when she woke up to use bathroom and felt pressure, when she wiped she noticed the baby had come out. Upon EMS arrival, they noted her HR at 240 SVT, but has since resolved en route with vagal maneuver. BP: 156/92. Upon arrival to ED, pt reports she felt more pressure and she delivered the placenta. Pt denies pain but still feels pressure "like theres something that needs to come out."

## 2014-06-05 ENCOUNTER — Encounter (HOSPITAL_COMMUNITY): Payer: Self-pay | Admitting: Obstetrics & Gynecology

## 2014-06-12 ENCOUNTER — Telehealth: Payer: Self-pay | Admitting: *Deleted

## 2014-06-12 NOTE — Telephone Encounter (Signed)
-----   Message from Tereso NewcomerUgonna A Anyanwu, MD sent at 06/03/2014  3:13 PM EDT ----- Regarding: Schedule postop appt with me - NEW patient Had D&C for retained placenta after 17 week delivery on 06/02/13  She needs to see me in 2-3 weeks  Thank you!  Vela ProseUgonna

## 2014-06-12 NOTE — Telephone Encounter (Signed)
Left message for patient to call back to the office to set up a follow up exam.

## 2014-06-19 ENCOUNTER — Encounter: Payer: Self-pay | Admitting: Obstetrics & Gynecology

## 2014-06-19 ENCOUNTER — Ambulatory Visit (INDEPENDENT_AMBULATORY_CARE_PROVIDER_SITE_OTHER): Payer: BLUE CROSS/BLUE SHIELD | Admitting: Obstetrics & Gynecology

## 2014-06-19 DIAGNOSIS — Z8742 Personal history of other diseases of the female genital tract: Secondary | ICD-10-CM

## 2014-06-19 DIAGNOSIS — Z8759 Personal history of other complications of pregnancy, childbirth and the puerperium: Secondary | ICD-10-CM | POA: Insufficient documentation

## 2014-06-19 DIAGNOSIS — Z09 Encounter for follow-up examination after completed treatment for conditions other than malignant neoplasm: Secondary | ICD-10-CM

## 2014-06-19 NOTE — Progress Notes (Signed)
    Subjective:     Cyril MourningBrooklyn Florea is a 34 y.o.  [phone] female who presents to the clinic 3 weeks status post ultrasound guided D&E for retained placenta after delivery/PPROM at 17 weeks. No bleeding, just scant brown discharge Eating a regular diet without difficulty. Bowel movements are normal. The patient is not having any pain.  Emotionally, she reports being stable, has a lot of support at home.  The following portions of the patient's history were reviewed and updated as appropriate: allergies, current medications, past family history, past medical history, past social history, past surgical history and problem list.  Review of Systems Pertinent items are noted in HPI.    Objective:    BP 131/84 mmHg  Pulse 86  Ht 5\' 5"  (1.651 m)  Wt 286 lb 9.6 oz (130.001 kg)  BMI 47.69 kg/m2  LMP 01/02/2014 General:  alert and no distress  Abdomen: soft, bowel sounds active, non-tender  Incision:  n/a     Assessment:    Doing well postoperatively. Operative findings again reviewed. Pathology report discussed.    Plan:   1. Continue any current medications. 2. Activity restrictions: none 3. Anticipated return to work: now. 4. Follow up as needed with private gynecologist,  routine preventative health maintenance measures emphasized. 5. Information about Heartstrings given to patient.   Jaynie CollinsUGONNA  ANYANWU, MD, FACOG Attending Obstetrician & Gynecologist Center for [facility], [facility]

## 2014-06-19 NOTE — Patient Instructions (Signed)
Return to clinic for any obstetric concerns or go to [facility]U for evaluation  

## 2015-02-08 ENCOUNTER — Other Ambulatory Visit: Payer: Self-pay | Admitting: Obstetrics & Gynecology

## 2015-02-09 LAB — CYTOLOGY - PAP

## 2015-07-26 ENCOUNTER — Other Ambulatory Visit: Payer: Self-pay | Admitting: Specialist

## 2015-08-28 DIAGNOSIS — Z01812 Encounter for preprocedural laboratory examination: Secondary | ICD-10-CM | POA: Diagnosis not present

## 2015-08-28 DIAGNOSIS — Z5181 Encounter for therapeutic drug level monitoring: Secondary | ICD-10-CM | POA: Diagnosis not present

## 2015-08-28 DIAGNOSIS — K912 Postsurgical malabsorption, not elsewhere classified: Secondary | ICD-10-CM | POA: Diagnosis not present

## 2015-08-28 DIAGNOSIS — R638 Other symptoms and signs concerning food and fluid intake: Secondary | ICD-10-CM | POA: Diagnosis not present

## 2015-09-01 DIAGNOSIS — I10 Essential (primary) hypertension: Secondary | ICD-10-CM | POA: Diagnosis not present

## 2015-09-01 DIAGNOSIS — S51819A Laceration without foreign body of unspecified forearm, initial encounter: Secondary | ICD-10-CM | POA: Diagnosis not present

## 2015-09-01 DIAGNOSIS — S59912A Unspecified injury of left forearm, initial encounter: Secondary | ICD-10-CM | POA: Diagnosis not present

## 2015-09-01 DIAGNOSIS — S8012XA Contusion of left lower leg, initial encounter: Secondary | ICD-10-CM | POA: Diagnosis not present

## 2015-09-01 DIAGNOSIS — S5402XA Injury of ulnar nerve at forearm level, left arm, initial encounter: Secondary | ICD-10-CM | POA: Diagnosis not present

## 2015-09-01 DIAGNOSIS — M79632 Pain in left forearm: Secondary | ICD-10-CM | POA: Diagnosis not present

## 2015-09-01 DIAGNOSIS — S51812A Laceration without foreign body of left forearm, initial encounter: Secondary | ICD-10-CM | POA: Diagnosis not present

## 2015-09-01 DIAGNOSIS — S8992XA Unspecified injury of left lower leg, initial encounter: Secondary | ICD-10-CM | POA: Diagnosis not present

## 2015-09-05 DIAGNOSIS — R0602 Shortness of breath: Secondary | ICD-10-CM | POA: Diagnosis not present

## 2015-09-11 DIAGNOSIS — Z4802 Encounter for removal of sutures: Secondary | ICD-10-CM | POA: Diagnosis not present

## 2015-09-13 ENCOUNTER — Ambulatory Visit
Admission: RE | Admit: 2015-09-13 | Discharge: 2015-09-13 | Disposition: A | Discharge status: Home / Self Care | Payer: BLUE CROSS/BLUE SHIELD | Source: Ambulatory Visit | Attending: Specialist | Admitting: Specialist

## 2015-09-13 DIAGNOSIS — Z01818 Encounter for other preprocedural examination: Secondary | ICD-10-CM | POA: Diagnosis not present

## 2015-09-18 DIAGNOSIS — I10 Essential (primary) hypertension: Secondary | ICD-10-CM | POA: Diagnosis not present

## 2015-10-06 DIAGNOSIS — R0683 Snoring: Secondary | ICD-10-CM | POA: Diagnosis not present

## 2015-10-10 DIAGNOSIS — L509 Urticaria, unspecified: Secondary | ICD-10-CM | POA: Diagnosis not present

## 2015-10-10 DIAGNOSIS — Z79899 Other long term (current) drug therapy: Secondary | ICD-10-CM | POA: Diagnosis not present

## 2015-10-10 DIAGNOSIS — I10 Essential (primary) hypertension: Secondary | ICD-10-CM | POA: Diagnosis not present

## 2015-10-12 DIAGNOSIS — K2950 Unspecified chronic gastritis without bleeding: Secondary | ICD-10-CM | POA: Diagnosis not present

## 2015-10-12 DIAGNOSIS — K2970 Gastritis, unspecified, without bleeding: Secondary | ICD-10-CM | POA: Diagnosis not present

## 2016-01-15 DIAGNOSIS — R635 Abnormal weight gain: Secondary | ICD-10-CM | POA: Diagnosis not present

## 2016-01-15 DIAGNOSIS — R252 Cramp and spasm: Secondary | ICD-10-CM | POA: Diagnosis not present

## 2016-01-15 DIAGNOSIS — I10 Essential (primary) hypertension: Secondary | ICD-10-CM | POA: Diagnosis not present

## 2016-01-15 DIAGNOSIS — R7989 Other specified abnormal findings of blood chemistry: Secondary | ICD-10-CM | POA: Diagnosis not present

## 2016-01-15 DIAGNOSIS — Z79899 Other long term (current) drug therapy: Secondary | ICD-10-CM | POA: Diagnosis not present

## 2016-02-15 DIAGNOSIS — K2970 Gastritis, unspecified, without bleeding: Secondary | ICD-10-CM | POA: Diagnosis not present

## 2016-02-21 DIAGNOSIS — Z01818 Encounter for other preprocedural examination: Secondary | ICD-10-CM | POA: Diagnosis not present

## 2016-02-26 DIAGNOSIS — Z6841 Body Mass Index (BMI) 40.0 and over, adult: Secondary | ICD-10-CM | POA: Diagnosis not present

## 2016-02-26 DIAGNOSIS — K2950 Unspecified chronic gastritis without bleeding: Secondary | ICD-10-CM | POA: Diagnosis not present

## 2016-02-26 DIAGNOSIS — I10 Essential (primary) hypertension: Secondary | ICD-10-CM | POA: Diagnosis not present

## 2016-03-13 DIAGNOSIS — Z713 Dietary counseling and surveillance: Secondary | ICD-10-CM | POA: Diagnosis not present

## 2016-08-05 DIAGNOSIS — Z111 Encounter for screening for respiratory tuberculosis: Secondary | ICD-10-CM | POA: Diagnosis not present

## 2016-08-05 DIAGNOSIS — Z0000 Encounter for general adult medical examination without abnormal findings: Secondary | ICD-10-CM | POA: Diagnosis not present

## 2016-08-05 DIAGNOSIS — Z020 Encounter for examination for admission to educational institution: Secondary | ICD-10-CM | POA: Diagnosis not present

## 2016-08-05 DIAGNOSIS — Z0184 Encounter for antibody response examination: Secondary | ICD-10-CM | POA: Diagnosis not present

## 2016-08-14 DIAGNOSIS — Z111 Encounter for screening for respiratory tuberculosis: Secondary | ICD-10-CM | POA: Diagnosis not present

## 2016-09-23 DIAGNOSIS — Z9884 Bariatric surgery status: Secondary | ICD-10-CM | POA: Diagnosis not present

## 2016-09-23 DIAGNOSIS — K912 Postsurgical malabsorption, not elsewhere classified: Secondary | ICD-10-CM | POA: Diagnosis not present

## 2016-10-10 DIAGNOSIS — Z9884 Bariatric surgery status: Secondary | ICD-10-CM | POA: Diagnosis not present

## 2016-10-10 DIAGNOSIS — K912 Postsurgical malabsorption, not elsewhere classified: Secondary | ICD-10-CM | POA: Diagnosis not present

## 2016-10-10 DIAGNOSIS — Z5181 Encounter for therapeutic drug level monitoring: Secondary | ICD-10-CM | POA: Diagnosis not present

## 2016-10-15 DIAGNOSIS — Z01419 Encounter for gynecological examination (general) (routine) without abnormal findings: Secondary | ICD-10-CM | POA: Diagnosis not present

## 2016-10-15 DIAGNOSIS — N760 Acute vaginitis: Secondary | ICD-10-CM | POA: Diagnosis not present

## 2016-10-15 DIAGNOSIS — Z124 Encounter for screening for malignant neoplasm of cervix: Secondary | ICD-10-CM | POA: Diagnosis not present

## 2016-10-15 DIAGNOSIS — Z6833 Body mass index (BMI) 33.0-33.9, adult: Secondary | ICD-10-CM | POA: Diagnosis not present

## 2017-07-28 DIAGNOSIS — Z0000 Encounter for general adult medical examination without abnormal findings: Secondary | ICD-10-CM | POA: Diagnosis not present

## 2017-07-28 DIAGNOSIS — Z79899 Other long term (current) drug therapy: Secondary | ICD-10-CM | POA: Diagnosis not present

## 2017-07-28 DIAGNOSIS — Z1231 Encounter for screening mammogram for malignant neoplasm of breast: Secondary | ICD-10-CM | POA: Diagnosis not present

## 2017-07-28 DIAGNOSIS — Z9884 Bariatric surgery status: Secondary | ICD-10-CM | POA: Diagnosis not present

## 2017-07-28 DIAGNOSIS — Z579 Occupational exposure to unspecified risk factor: Secondary | ICD-10-CM | POA: Diagnosis not present

## 2017-07-28 DIAGNOSIS — Z1339 Encounter for screening examination for other mental health and behavioral disorders: Secondary | ICD-10-CM | POA: Diagnosis not present

## 2017-07-28 DIAGNOSIS — Z1331 Encounter for screening for depression: Secondary | ICD-10-CM | POA: Diagnosis not present

## 2017-07-28 DIAGNOSIS — Z111 Encounter for screening for respiratory tuberculosis: Secondary | ICD-10-CM | POA: Diagnosis not present

## 2017-10-05 IMAGING — CR DG CHEST 2V
1 series · 2 of 2 positions shown · non-contrast
Comparison: None.

CLINICAL DATA: Preop for bariatric surgery, former smoking history

EXAM:
CHEST  2 VIEW

[Series 1: dg chest 2 view · 0.14mm/px · 2 of 2 slices shown]
[im 1/2]
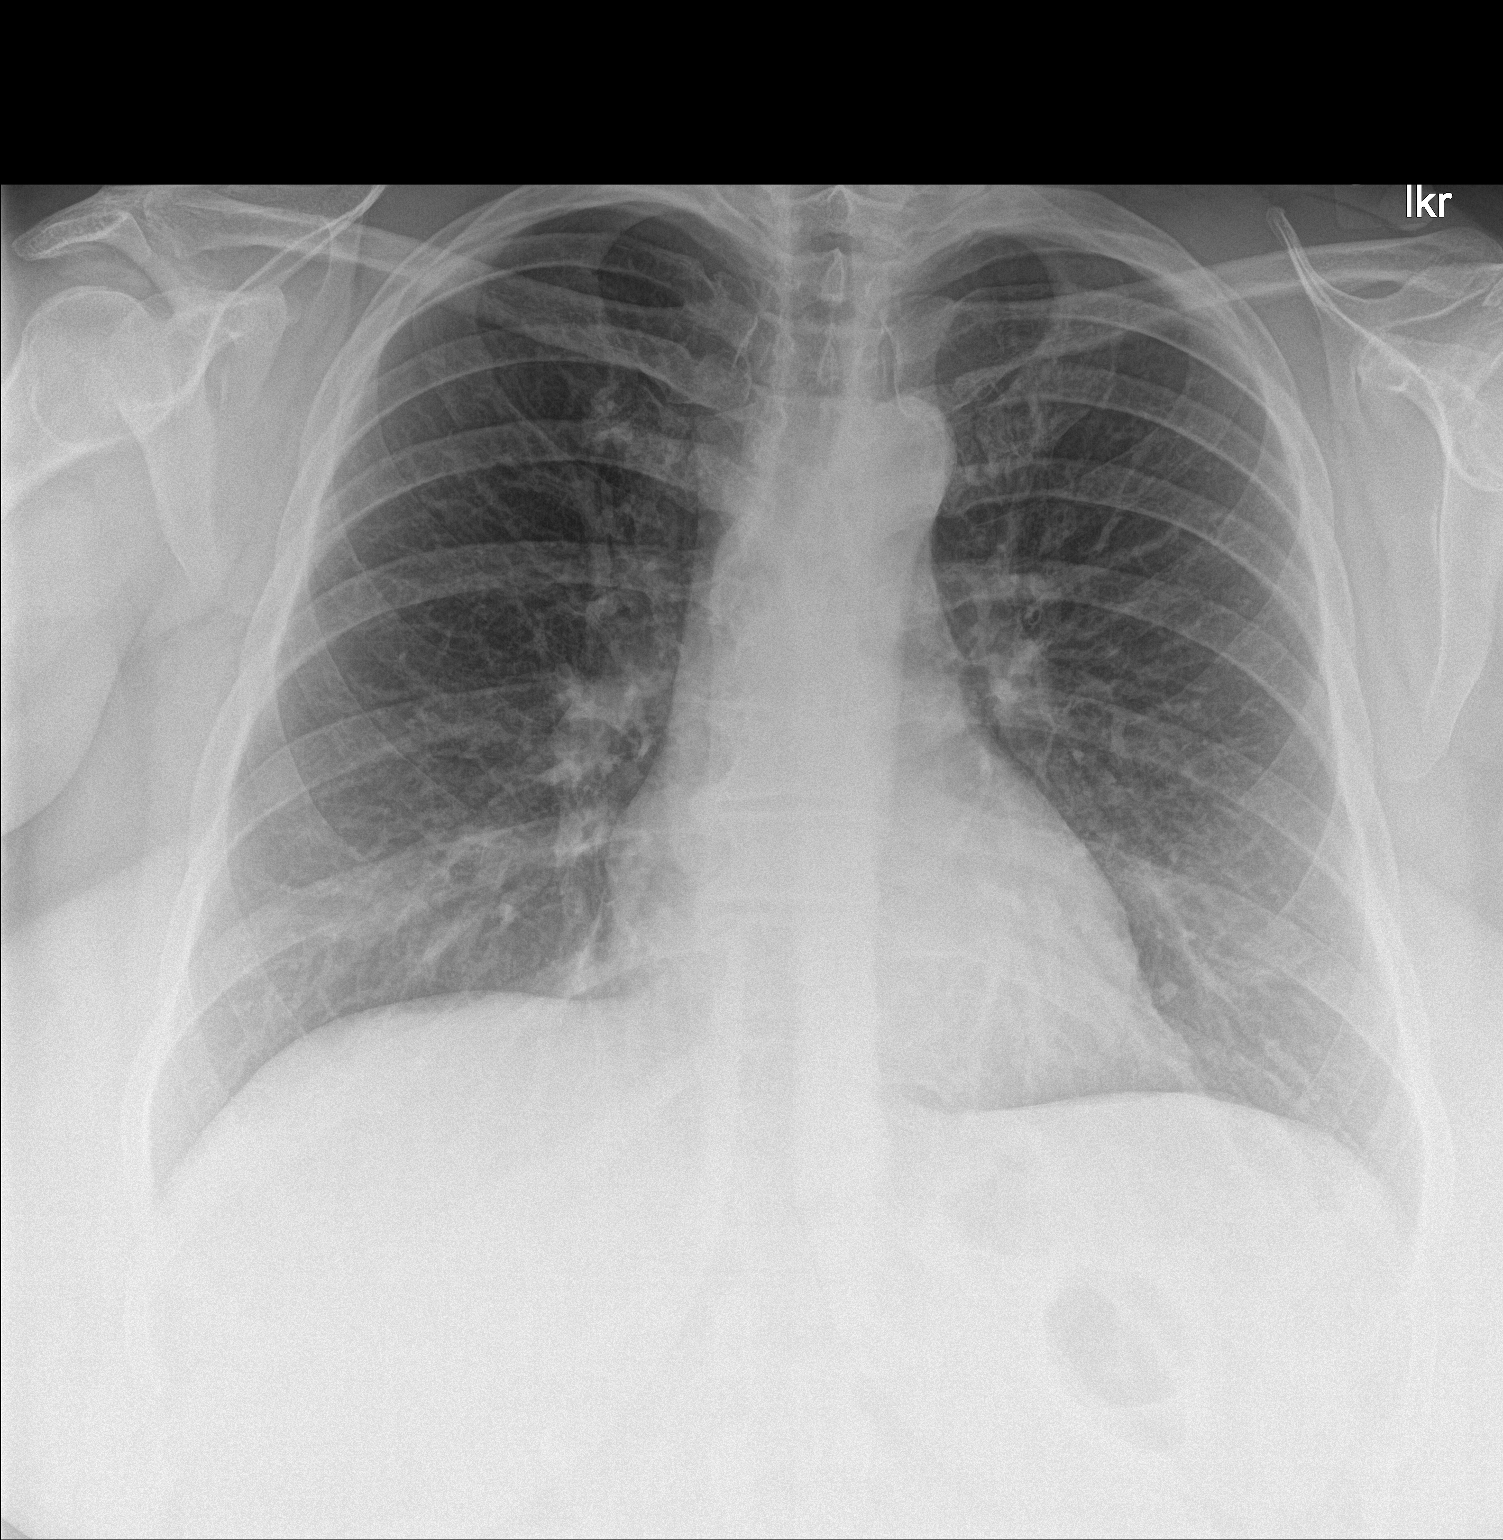
[im 2/2]
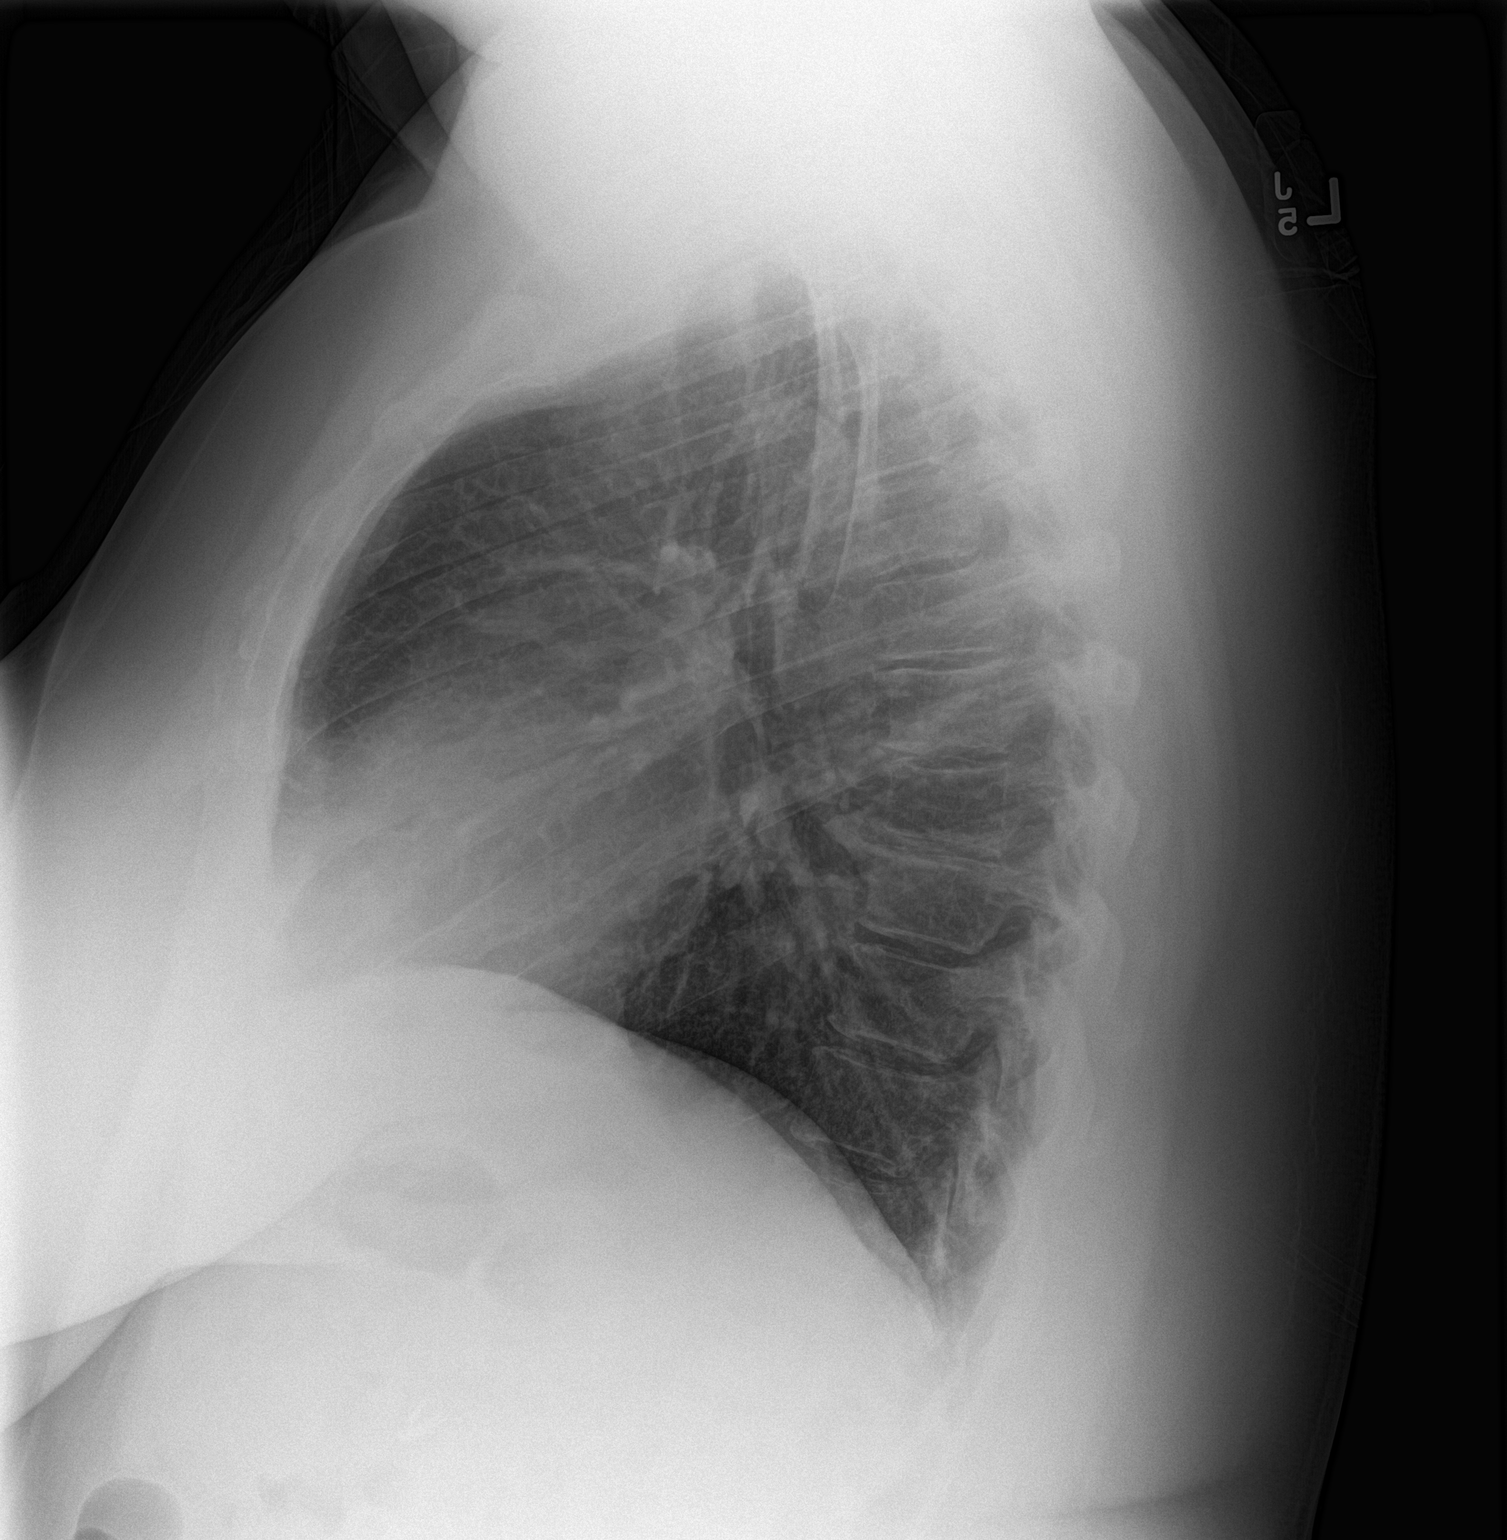

[2 of 2 positions shown; findings below may reference images not displayed]

FINDINGS: No active infiltrate or effusion is seen. Mediastinal and hilar
contours are unremarkable. The heart is within normal limits in
size. No bony abnormality is seen.
IMPRESSION: No active cardiopulmonary disease.

## 2017-10-22 DIAGNOSIS — Z6832 Body mass index (BMI) 32.0-32.9, adult: Secondary | ICD-10-CM | POA: Diagnosis not present

## 2017-10-22 DIAGNOSIS — N898 Other specified noninflammatory disorders of vagina: Secondary | ICD-10-CM | POA: Diagnosis not present

## 2017-10-22 DIAGNOSIS — Z01419 Encounter for gynecological examination (general) (routine) without abnormal findings: Secondary | ICD-10-CM | POA: Diagnosis not present

## 2018-03-02 DIAGNOSIS — R197 Diarrhea, unspecified: Secondary | ICD-10-CM | POA: Diagnosis not present

## 2018-03-02 DIAGNOSIS — R112 Nausea with vomiting, unspecified: Secondary | ICD-10-CM | POA: Diagnosis not present

## 2018-03-02 DIAGNOSIS — Z6831 Body mass index (BMI) 31.0-31.9, adult: Secondary | ICD-10-CM | POA: Diagnosis not present

## 2018-03-11 DIAGNOSIS — Z6832 Body mass index (BMI) 32.0-32.9, adult: Secondary | ICD-10-CM | POA: Diagnosis not present

## 2018-03-11 DIAGNOSIS — J029 Acute pharyngitis, unspecified: Secondary | ICD-10-CM | POA: Diagnosis not present

## 2018-03-11 DIAGNOSIS — E669 Obesity, unspecified: Secondary | ICD-10-CM | POA: Diagnosis not present

## 2018-09-08 DIAGNOSIS — I8311 Varicose veins of right lower extremity with inflammation: Secondary | ICD-10-CM | POA: Diagnosis not present

## 2018-09-08 DIAGNOSIS — I8312 Varicose veins of left lower extremity with inflammation: Secondary | ICD-10-CM | POA: Diagnosis not present

## 2018-09-14 DIAGNOSIS — Z20828 Contact with and (suspected) exposure to other viral communicable diseases: Secondary | ICD-10-CM | POA: Diagnosis not present

## 2018-09-17 DIAGNOSIS — I8311 Varicose veins of right lower extremity with inflammation: Secondary | ICD-10-CM | POA: Diagnosis not present

## 2018-09-17 DIAGNOSIS — I8312 Varicose veins of left lower extremity with inflammation: Secondary | ICD-10-CM | POA: Diagnosis not present

## 2018-10-01 DIAGNOSIS — I8311 Varicose veins of right lower extremity with inflammation: Secondary | ICD-10-CM | POA: Diagnosis not present

## 2018-10-01 DIAGNOSIS — I8312 Varicose veins of left lower extremity with inflammation: Secondary | ICD-10-CM | POA: Diagnosis not present

## 2018-11-02 DIAGNOSIS — I10 Essential (primary) hypertension: Secondary | ICD-10-CM | POA: Diagnosis not present

## 2018-11-02 DIAGNOSIS — K219 Gastro-esophageal reflux disease without esophagitis: Secondary | ICD-10-CM | POA: Diagnosis not present

## 2018-11-02 DIAGNOSIS — Z9884 Bariatric surgery status: Secondary | ICD-10-CM | POA: Diagnosis not present

## 2018-11-02 DIAGNOSIS — Z87891 Personal history of nicotine dependence: Secondary | ICD-10-CM | POA: Diagnosis not present

## 2018-11-18 DIAGNOSIS — Z9889 Other specified postprocedural states: Secondary | ICD-10-CM | POA: Diagnosis not present

## 2018-11-18 DIAGNOSIS — Z9049 Acquired absence of other specified parts of digestive tract: Secondary | ICD-10-CM | POA: Diagnosis not present

## 2018-11-18 DIAGNOSIS — K210 Gastro-esophageal reflux disease with esophagitis: Secondary | ICD-10-CM | POA: Diagnosis not present

## 2018-11-18 DIAGNOSIS — Z9884 Bariatric surgery status: Secondary | ICD-10-CM | POA: Diagnosis not present

## 2018-11-18 DIAGNOSIS — K319 Disease of stomach and duodenum, unspecified: Secondary | ICD-10-CM | POA: Diagnosis not present

## 2018-11-18 DIAGNOSIS — I10 Essential (primary) hypertension: Secondary | ICD-10-CM | POA: Diagnosis not present

## 2018-11-18 DIAGNOSIS — Z87891 Personal history of nicotine dependence: Secondary | ICD-10-CM | POA: Diagnosis not present

## 2018-11-18 DIAGNOSIS — K449 Diaphragmatic hernia without obstruction or gangrene: Secondary | ICD-10-CM | POA: Diagnosis not present

## 2018-11-18 DIAGNOSIS — E282 Polycystic ovarian syndrome: Secondary | ICD-10-CM | POA: Diagnosis not present

## 2018-11-18 DIAGNOSIS — Z6832 Body mass index (BMI) 32.0-32.9, adult: Secondary | ICD-10-CM | POA: Diagnosis not present

## 2018-11-18 DIAGNOSIS — Z79899 Other long term (current) drug therapy: Secondary | ICD-10-CM | POA: Diagnosis not present

## 2018-12-02 DIAGNOSIS — I8311 Varicose veins of right lower extremity with inflammation: Secondary | ICD-10-CM | POA: Diagnosis not present

## 2018-12-02 DIAGNOSIS — I8312 Varicose veins of left lower extremity with inflammation: Secondary | ICD-10-CM | POA: Diagnosis not present

## 2018-12-07 DIAGNOSIS — Z20828 Contact with and (suspected) exposure to other viral communicable diseases: Secondary | ICD-10-CM | POA: Diagnosis not present

## 2018-12-10 DIAGNOSIS — I8311 Varicose veins of right lower extremity with inflammation: Secondary | ICD-10-CM | POA: Diagnosis not present

## 2018-12-15 DIAGNOSIS — I8312 Varicose veins of left lower extremity with inflammation: Secondary | ICD-10-CM | POA: Diagnosis not present

## 2018-12-21 DIAGNOSIS — I8311 Varicose veins of right lower extremity with inflammation: Secondary | ICD-10-CM | POA: Diagnosis not present

## 2018-12-24 DIAGNOSIS — Z0000 Encounter for general adult medical examination without abnormal findings: Secondary | ICD-10-CM | POA: Diagnosis not present

## 2018-12-24 DIAGNOSIS — Z9884 Bariatric surgery status: Secondary | ICD-10-CM | POA: Diagnosis not present

## 2018-12-24 DIAGNOSIS — Z6832 Body mass index (BMI) 32.0-32.9, adult: Secondary | ICD-10-CM | POA: Diagnosis not present

## 2018-12-29 DIAGNOSIS — I8312 Varicose veins of left lower extremity with inflammation: Secondary | ICD-10-CM | POA: Diagnosis not present

## 2019-01-04 DIAGNOSIS — Z9884 Bariatric surgery status: Secondary | ICD-10-CM | POA: Diagnosis not present

## 2019-01-04 DIAGNOSIS — K449 Diaphragmatic hernia without obstruction or gangrene: Secondary | ICD-10-CM | POA: Diagnosis not present

## 2019-01-04 DIAGNOSIS — I8311 Varicose veins of right lower extremity with inflammation: Secondary | ICD-10-CM | POA: Diagnosis not present

## 2019-01-04 DIAGNOSIS — K219 Gastro-esophageal reflux disease without esophagitis: Secondary | ICD-10-CM | POA: Diagnosis not present

## 2019-01-04 DIAGNOSIS — K2100 Gastro-esophageal reflux disease with esophagitis, without bleeding: Secondary | ICD-10-CM | POA: Diagnosis not present

## 2019-01-12 DIAGNOSIS — I8312 Varicose veins of left lower extremity with inflammation: Secondary | ICD-10-CM | POA: Diagnosis not present

## 2019-01-18 DIAGNOSIS — I8312 Varicose veins of left lower extremity with inflammation: Secondary | ICD-10-CM | POA: Diagnosis not present

## 2019-01-18 DIAGNOSIS — I8311 Varicose veins of right lower extremity with inflammation: Secondary | ICD-10-CM | POA: Diagnosis not present

## 2019-01-24 DIAGNOSIS — K449 Diaphragmatic hernia without obstruction or gangrene: Secondary | ICD-10-CM | POA: Diagnosis not present

## 2019-01-24 DIAGNOSIS — Z01818 Encounter for other preprocedural examination: Secondary | ICD-10-CM | POA: Diagnosis not present

## 2019-01-24 DIAGNOSIS — K219 Gastro-esophageal reflux disease without esophagitis: Secondary | ICD-10-CM | POA: Diagnosis not present

## 2019-01-26 DIAGNOSIS — I8312 Varicose veins of left lower extremity with inflammation: Secondary | ICD-10-CM | POA: Diagnosis not present

## 2019-01-26 DIAGNOSIS — I8311 Varicose veins of right lower extremity with inflammation: Secondary | ICD-10-CM | POA: Diagnosis not present

## 2019-02-03 DIAGNOSIS — Z6832 Body mass index (BMI) 32.0-32.9, adult: Secondary | ICD-10-CM | POA: Diagnosis not present

## 2019-02-03 DIAGNOSIS — Z9884 Bariatric surgery status: Secondary | ICD-10-CM | POA: Diagnosis not present

## 2019-02-03 DIAGNOSIS — Z20828 Contact with and (suspected) exposure to other viral communicable diseases: Secondary | ICD-10-CM | POA: Diagnosis not present

## 2019-02-03 DIAGNOSIS — K219 Gastro-esophageal reflux disease without esophagitis: Secondary | ICD-10-CM | POA: Diagnosis not present

## 2019-02-03 DIAGNOSIS — Z6831 Body mass index (BMI) 31.0-31.9, adult: Secondary | ICD-10-CM | POA: Diagnosis not present

## 2019-02-03 DIAGNOSIS — Z6833 Body mass index (BMI) 33.0-33.9, adult: Secondary | ICD-10-CM | POA: Diagnosis not present

## 2019-02-03 DIAGNOSIS — K449 Diaphragmatic hernia without obstruction or gangrene: Secondary | ICD-10-CM | POA: Diagnosis not present

## 2019-02-03 DIAGNOSIS — K2100 Gastro-esophageal reflux disease with esophagitis, without bleeding: Secondary | ICD-10-CM | POA: Diagnosis not present

## 2019-02-03 DIAGNOSIS — K5900 Constipation, unspecified: Secondary | ICD-10-CM | POA: Diagnosis not present

## 2019-05-29 DIAGNOSIS — N3000 Acute cystitis without hematuria: Secondary | ICD-10-CM | POA: Diagnosis not present

## 2019-05-29 DIAGNOSIS — N3090 Cystitis, unspecified without hematuria: Secondary | ICD-10-CM | POA: Diagnosis not present

## 2019-06-30 DIAGNOSIS — N390 Urinary tract infection, site not specified: Secondary | ICD-10-CM | POA: Diagnosis not present

## 2019-07-08 DIAGNOSIS — Z9884 Bariatric surgery status: Secondary | ICD-10-CM | POA: Diagnosis not present

## 2019-07-08 DIAGNOSIS — N390 Urinary tract infection, site not specified: Secondary | ICD-10-CM | POA: Diagnosis not present

## 2019-12-28 DIAGNOSIS — Z1331 Encounter for screening for depression: Secondary | ICD-10-CM | POA: Diagnosis not present

## 2019-12-28 DIAGNOSIS — Z9884 Bariatric surgery status: Secondary | ICD-10-CM | POA: Diagnosis not present

## 2019-12-28 DIAGNOSIS — Z0000 Encounter for general adult medical examination without abnormal findings: Secondary | ICD-10-CM | POA: Diagnosis not present

## 2020-02-09 DIAGNOSIS — D509 Iron deficiency anemia, unspecified: Secondary | ICD-10-CM | POA: Diagnosis not present

## 2020-02-16 DIAGNOSIS — D509 Iron deficiency anemia, unspecified: Secondary | ICD-10-CM | POA: Diagnosis not present

## 2020-06-25 DIAGNOSIS — D509 Iron deficiency anemia, unspecified: Secondary | ICD-10-CM | POA: Diagnosis not present

## 2020-06-25 DIAGNOSIS — E559 Vitamin D deficiency, unspecified: Secondary | ICD-10-CM | POA: Diagnosis not present

## 2020-06-25 DIAGNOSIS — Z020 Encounter for examination for admission to educational institution: Secondary | ICD-10-CM | POA: Diagnosis not present

## 2020-08-24 DIAGNOSIS — Z1159 Encounter for screening for other viral diseases: Secondary | ICD-10-CM | POA: Diagnosis not present

## 2020-12-27 DIAGNOSIS — E559 Vitamin D deficiency, unspecified: Secondary | ICD-10-CM | POA: Diagnosis not present

## 2020-12-27 DIAGNOSIS — Z1331 Encounter for screening for depression: Secondary | ICD-10-CM | POA: Diagnosis not present

## 2020-12-27 DIAGNOSIS — Z0000 Encounter for general adult medical examination without abnormal findings: Secondary | ICD-10-CM | POA: Diagnosis not present

## 2020-12-27 DIAGNOSIS — D509 Iron deficiency anemia, unspecified: Secondary | ICD-10-CM | POA: Diagnosis not present

## 2020-12-27 DIAGNOSIS — Z9884 Bariatric surgery status: Secondary | ICD-10-CM | POA: Diagnosis not present

## 2021-01-30 ENCOUNTER — Telehealth: Payer: Self-pay | Admitting: Oncology

## 2021-01-30 NOTE — Telephone Encounter (Signed)
Scheduled appt per 11/30 referral. Pt is aware of appt date and time.  

## 2021-02-06 NOTE — Progress Notes (Signed)
[facility]  [address] [place],  [place]  [id number] [phone]  Clinic Day:  02/07/2021  Referring physician: Ernestene Kiel MD   HISTORY OF PRESENT ILLNESS:  The patient is a 40 y.o. female  who I was asked to consult upon for iron deficiency anemia.  Recent iron studies showed a low ferritin of 6, a low serum iron of 25, a TIBC of 487, and a low iron saturation of 5%. The patient claims to have menstrual cycles which last 5 days, many of which are associated with clots.  She is scheduled for a Pap smear/pelvic exam later this month.  Of note, this patient gastric sleeve surgery in 2017.  She also has underwent duodenal switch surgery in 2019.  She has known about her iron deficiency anemia.  She was given IV iron in December 2021.  She currently has cravings for both corn and ice.  She claims to be taking a bariatric vitamin which contains iron.    PAST MEDICAL HISTORY:   Past Medical History:  Diagnosis Date   Hypertension   Vitamin D deficiency Iron deficiency anemia  PAST SURGICAL HISTORY:   Past Surgical History:  Procedure Laterality Date   CHOLECYSTECTOMY     DILATION AND EVACUATION N/A 06/03/2014   Procedure: DILATATION AND EVACUATION;  Surgeon: Osborne Oman, MD;  Location: [facility];  Service: Gynecology;  Laterality: N/A;  Gastric sleeve surgery Duodenal switch surgery Varicose vein surgery  CURRENT MEDICATIONS:   Current Outpatient Medications  Medication Sig Dispense Refill   ferrous sulfate 325 (65 FE) MG tablet Take 325 mg by mouth daily with breakfast.     Vitamin D, Ergocalciferol, (DRISDOL) 1.25 MG (50000 UNIT) CAPS capsule Take 50,000 Units by mouth once a week.     No current facility-administered medications for this visit.    ALLERGIES:  No Known Allergies  FAMILY HISTORY:  Her father died from metastatic bladder cancer.  Her mother has hypertension.  She has 3 brothers and 1 sister who are relatively  healthy.  SOCIAL HISTORY:  The patient was born and raised in [place].  She lives in the [place] with her husband of 22 years.  They have 2 children.  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  She did smoke a half a pack of cigarettes daily for 8 years before quitting 14 years ago.  There is no history of alcohol abuse.  REVIEW OF SYSTEMS:  Review of Systems  Constitutional:  Negative for fatigue and fever.  HENT:   Negative for hearing loss and sore throat.   Eyes:  Negative for eye problems.  Respiratory:  Negative for chest tightness, cough and hemoptysis.   Cardiovascular:  Positive for palpitations. Negative for chest pain.  Gastrointestinal:  Positive for constipation. Negative for abdominal distention, abdominal pain, blood in stool, diarrhea, nausea and vomiting.  Endocrine: Negative for hot flashes.  Genitourinary:  Negative for difficulty urinating, dysuria, frequency, hematuria and nocturia.   Musculoskeletal:  Negative for arthralgias, back pain, gait problem and myalgias.  Skin: Negative.  Negative for itching and rash.  Neurological: Negative.  Negative for dizziness, extremity weakness, gait problem, headaches, light-headedness and numbness.  Hematological: Negative.   Psychiatric/Behavioral: Negative.  Negative for depression and suicidal ideas. The patient is not nervous/anxious.     PHYSICAL EXAM:  Blood pressure 130/80, pulse 75, temperature 98.6 F (37 C), resp. rate 16, height 5\' 5"  (1.651 m), weight  203 lb 4.8 oz (92.2 kg), SpO2 99 %, unknown if currently breastfeeding. Wt Readings from Last 3 Encounters:  02/11/21 202 lb (91.6 kg)  02/07/21 203 lb 4.8 oz (92.2 kg)  06/19/14 286 lb 9.6 oz (130 kg)   Body mass index is 33.83 kg/m. Performance status (ECOG): 0 - Asymptomatic Physical Exam Constitutional:      Appearance: Normal appearance. She is not ill-appearing.  HENT:     Mouth/Throat:     Mouth: Mucous membranes are moist.      Pharynx: Oropharynx is clear. No oropharyngeal exudate or posterior oropharyngeal erythema.  Cardiovascular:     Rate and Rhythm: Normal rate and regular rhythm.     Heart sounds: No murmur heard.   No friction rub. No gallop.  Pulmonary:     Effort: Pulmonary effort is normal. No respiratory distress.     Breath sounds: Normal breath sounds. No wheezing, rhonchi or rales.  Abdominal:     General: Bowel sounds are normal. There is no distension.     Palpations: Abdomen is soft. There is no mass.     Tenderness: There is no abdominal tenderness.  Musculoskeletal:        General: No swelling.     Right lower leg: No edema.     Left lower leg: No edema.  Lymphadenopathy:     Cervical: No cervical adenopathy.     Upper Body:     Right upper body: No supraclavicular or axillary adenopathy.     Left upper body: No supraclavicular or axillary adenopathy.     Lower Body: No right inguinal adenopathy. No left inguinal adenopathy.  Skin:    General: Skin is warm.     Coloration: Skin is not jaundiced.     Findings: No lesion or rash.  Neurological:     General: No focal deficit present.     Mental Status: She is alert and oriented to person, place, and time. Mental status is at baseline.  Psychiatric:        Mood and Affect: Mood normal.        Behavior: Behavior normal.        Thought Content: Thought content normal.    LABS:    Latest Reference Range & Units 02/07/21 00:00  WBC  7.6 (E)  RBC 3.87 - 5.11  4.41 (E)  Hemoglobin 12.0 - 16.0  9.6 ! (E)  HCT 36 - 46  31 ! (E)  Platelets 150 - 399  327 (E)  !: Data is abnormal (E): External lab result  Latest Reference Range & Units 02/07/21 14:52 02/07/21 14:55  Iron 28 - 170 ug/dL 28   UIBC ug/dL 696   TIBC 789 - 381 ug/dL 017 (H)   Saturation Ratios 10.4 - 31.8 % 5 (L)   Ferritin 11 - 307 ng/mL  4 (L)  Folate >5.9 ng/mL 8.5   Vitamin B12 180 - 914 pg/mL 228   (H): Data is abnormally high (L): Data is abnormally  low  CMP Latest Ref Rng & Units 06/03/2014  Glucose 70 - 99 mg/dL 510(C)  BUN 6 - 23 mg/dL <5(E)  Creatinine 5.27 - 1.10 mg/dL 7.82  Sodium 423 - 536 mmol/L 136  Potassium 3.5 - 5.1 mmol/L 3.5  Chloride 96 - 112 mmol/L 106  CO2 19 - 32 mmol/L 26  Calcium 8.4 - 10.5 mg/dL 8.8   ASSESSMENT & PLAN:  A 40 y.o. female whose labs today clearly reflect the presence of iron deficiency  anemia.  The patient understands this is likely related to her history of her gastric/duodenal surgery, as well as her heavy menstrual cycles.  I will arrange for her to receive IV iron over these next few weeks to rapidly replenish her iron stores and normalize her hemoglobin.  As mentioned previously, she is scheduled for a Pap smear/pelvic exam in the forthcoming weeks, for which I am in complete agreement.  Otherwise, I will see her back in 3 months to reassess her iron and hemoglobin levels to see how well she responded to her upcoming IV iron.  The patient understands all the plans discussed today and is in agreement with them.  I do appreciate Dr Ernestene Kiel for his new consult.   Gabriel Conry Macarthur Critchley, MD

## 2021-02-07 ENCOUNTER — Other Ambulatory Visit: Payer: Self-pay | Admitting: Oncology

## 2021-02-07 ENCOUNTER — Telehealth: Payer: Self-pay | Admitting: Oncology

## 2021-02-07 ENCOUNTER — Other Ambulatory Visit: Payer: Self-pay | Admitting: Hematology and Oncology

## 2021-02-07 ENCOUNTER — Inpatient Hospital Stay: Payer: BC Managed Care – PPO

## 2021-02-07 ENCOUNTER — Inpatient Hospital Stay: Payer: BC Managed Care – PPO | Attending: Oncology | Admitting: Oncology

## 2021-02-07 DIAGNOSIS — D539 Nutritional anemia, unspecified: Secondary | ICD-10-CM

## 2021-02-07 DIAGNOSIS — Z9884 Bariatric surgery status: Secondary | ICD-10-CM | POA: Diagnosis not present

## 2021-02-07 DIAGNOSIS — D509 Iron deficiency anemia, unspecified: Secondary | ICD-10-CM | POA: Insufficient documentation

## 2021-02-07 DIAGNOSIS — D508 Other iron deficiency anemias: Secondary | ICD-10-CM

## 2021-02-07 DIAGNOSIS — N920 Excessive and frequent menstruation with regular cycle: Secondary | ICD-10-CM | POA: Diagnosis not present

## 2021-02-07 LAB — IRON AND TIBC
Iron: 28 ug/dL (ref 28–170)
Saturation Ratios: 5 % — ABNORMAL LOW (ref 10.4–31.8)
TIBC: 593 ug/dL — ABNORMAL HIGH (ref 250–450)
UIBC: 565 ug/dL

## 2021-02-07 LAB — VITAMIN B12: Vitamin B-12: 228 pg/mL (ref 180–914)

## 2021-02-07 LAB — CBC AND DIFFERENTIAL
HCT: 31 — AB (ref 36–46)
Hemoglobin: 9.6 — AB (ref 12.0–16.0)
Neutrophils Absolute: 5.17
Platelets: 327 (ref 150–399)
WBC: 7.6

## 2021-02-07 LAB — FERRITIN: Ferritin: 4 ng/mL — ABNORMAL LOW (ref 11–307)

## 2021-02-07 LAB — CBC: RBC: 4.41 (ref 3.87–5.11)

## 2021-02-07 LAB — FOLATE: Folate: 8.5 ng/mL (ref >5.9)

## 2021-02-07 NOTE — Telephone Encounter (Signed)
Per 12/8 LOS, patient scheduled for 2 IV Raj Janus, March 2023 Appt's.  Gave patient Appt Summary

## 2021-02-07 NOTE — Addendum Note (Signed)
Addended by: Domenic Schwab on: 02/07/2021 04:08 PM   Modules accepted: Orders

## 2021-02-07 NOTE — Progress Notes (Signed)
..  Feraheme orders changed to venofer due to insurance plan preference.  Message to scheduling has been sent.  

## 2021-02-08 ENCOUNTER — Telehealth: Payer: Self-pay | Admitting: Oncology

## 2021-02-08 ENCOUNTER — Other Ambulatory Visit: Payer: Self-pay | Admitting: Pharmacist

## 2021-02-08 NOTE — Telephone Encounter (Signed)
Patient scheduled for 5 Venofer Ins - She was notified

## 2021-02-11 ENCOUNTER — Other Ambulatory Visit: Payer: Self-pay

## 2021-02-11 ENCOUNTER — Inpatient Hospital Stay: Payer: BC Managed Care – PPO

## 2021-02-11 ENCOUNTER — Encounter: Payer: Self-pay | Admitting: Oncology

## 2021-02-11 VITALS — BP 144/81 | HR 86 | Temp 98.1°F | Resp 18 | Ht 65.0 in | Wt 202.0 lb

## 2021-02-11 DIAGNOSIS — D500 Iron deficiency anemia secondary to blood loss (chronic): Secondary | ICD-10-CM

## 2021-02-11 DIAGNOSIS — N920 Excessive and frequent menstruation with regular cycle: Secondary | ICD-10-CM | POA: Diagnosis not present

## 2021-02-11 DIAGNOSIS — D509 Iron deficiency anemia, unspecified: Secondary | ICD-10-CM | POA: Diagnosis not present

## 2021-02-11 DIAGNOSIS — Z9884 Bariatric surgery status: Secondary | ICD-10-CM | POA: Diagnosis not present

## 2021-02-11 MED ORDER — SODIUM CHLORIDE 0.9 % IV SOLN: Freq: Once | INTRAVENOUS | Status: AC

## 2021-02-11 MED ORDER — SODIUM CHLORIDE 0.9 % IV SOLN
200.0000 mg | Freq: Once | INTRAVENOUS | Status: AC
  Administered 2021-02-11: 200 mg via INTRAVENOUS
  Filled 2021-02-11: qty 10

## 2021-02-11 NOTE — Patient Instructions (Signed)

## 2021-02-12 MED FILL — Iron Sucrose Inj 20 MG/ML (Fe Equiv): INTRAVENOUS | Qty: 10 | Status: AC

## 2021-02-13 ENCOUNTER — Other Ambulatory Visit: Payer: Self-pay

## 2021-02-13 ENCOUNTER — Inpatient Hospital Stay: Payer: BC Managed Care – PPO

## 2021-02-13 VITALS — BP 129/76 | HR 68 | Temp 97.7°F | Resp 18

## 2021-02-13 DIAGNOSIS — Z9884 Bariatric surgery status: Secondary | ICD-10-CM | POA: Diagnosis not present

## 2021-02-13 DIAGNOSIS — D500 Iron deficiency anemia secondary to blood loss (chronic): Secondary | ICD-10-CM

## 2021-02-13 DIAGNOSIS — N920 Excessive and frequent menstruation with regular cycle: Secondary | ICD-10-CM | POA: Diagnosis not present

## 2021-02-13 DIAGNOSIS — D509 Iron deficiency anemia, unspecified: Secondary | ICD-10-CM | POA: Diagnosis not present

## 2021-02-13 MED ORDER — SODIUM CHLORIDE 0.9 % IV SOLN: Freq: Once | INTRAVENOUS | Status: AC

## 2021-02-13 MED ORDER — SODIUM CHLORIDE 0.9 % IV SOLN
200.0000 mg | Freq: Once | INTRAVENOUS | Status: AC
  Administered 2021-02-13: 200 mg via INTRAVENOUS
  Filled 2021-02-13: qty 200

## 2021-02-13 NOTE — Patient Instructions (Signed)

## 2021-02-13 NOTE — Progress Notes (Signed)
1300- Placed in infusion chair by Albesa Seen- Weight deferred by Albesa Seen RN

## 2021-02-14 MED FILL — Iron Sucrose Inj 20 MG/ML (Fe Equiv): INTRAVENOUS | Qty: 10 | Status: AC

## 2021-02-15 ENCOUNTER — Other Ambulatory Visit: Payer: Self-pay

## 2021-02-15 ENCOUNTER — Inpatient Hospital Stay: Payer: BC Managed Care – PPO

## 2021-02-15 VITALS — BP 127/82 | HR 78 | Temp 98.3°F | Resp 18 | Ht 65.0 in | Wt 204.0 lb

## 2021-02-15 DIAGNOSIS — D509 Iron deficiency anemia, unspecified: Secondary | ICD-10-CM | POA: Diagnosis not present

## 2021-02-15 DIAGNOSIS — Z9884 Bariatric surgery status: Secondary | ICD-10-CM | POA: Diagnosis not present

## 2021-02-15 DIAGNOSIS — N920 Excessive and frequent menstruation with regular cycle: Secondary | ICD-10-CM | POA: Diagnosis not present

## 2021-02-15 DIAGNOSIS — D500 Iron deficiency anemia secondary to blood loss (chronic): Secondary | ICD-10-CM

## 2021-02-15 MED ORDER — SODIUM CHLORIDE 0.9 % IV SOLN
200.0000 mg | Freq: Once | INTRAVENOUS | Status: AC
  Administered 2021-02-15: 200 mg via INTRAVENOUS
  Filled 2021-02-15: qty 200

## 2021-02-15 MED ORDER — SODIUM CHLORIDE 0.9 % IV SOLN: Freq: Once | INTRAVENOUS | Status: AC

## 2021-02-15 NOTE — Patient Instructions (Signed)

## 2021-02-18 ENCOUNTER — Ambulatory Visit: Payer: BC Managed Care – PPO

## 2021-02-18 DIAGNOSIS — Z01419 Encounter for gynecological examination (general) (routine) without abnormal findings: Secondary | ICD-10-CM | POA: Diagnosis not present

## 2021-02-18 DIAGNOSIS — Z124 Encounter for screening for malignant neoplasm of cervix: Secondary | ICD-10-CM | POA: Diagnosis not present

## 2021-02-19 ENCOUNTER — Other Ambulatory Visit: Payer: Self-pay

## 2021-02-19 ENCOUNTER — Inpatient Hospital Stay: Payer: BC Managed Care – PPO

## 2021-02-19 VITALS — BP 141/71 | HR 81 | Temp 98.1°F | Resp 18 | Ht 65.0 in | Wt 204.0 lb

## 2021-02-19 DIAGNOSIS — N920 Excessive and frequent menstruation with regular cycle: Secondary | ICD-10-CM | POA: Diagnosis not present

## 2021-02-19 DIAGNOSIS — Z9884 Bariatric surgery status: Secondary | ICD-10-CM | POA: Diagnosis not present

## 2021-02-19 DIAGNOSIS — D500 Iron deficiency anemia secondary to blood loss (chronic): Secondary | ICD-10-CM

## 2021-02-19 DIAGNOSIS — D509 Iron deficiency anemia, unspecified: Secondary | ICD-10-CM | POA: Diagnosis not present

## 2021-02-19 MED ORDER — SODIUM CHLORIDE 0.9 % IV SOLN: Freq: Once | INTRAVENOUS | Status: AC

## 2021-02-19 MED ORDER — SODIUM CHLORIDE 0.9 % IV SOLN
200.0000 mg | Freq: Once | INTRAVENOUS | Status: AC
  Administered 2021-02-19: 200 mg via INTRAVENOUS
  Filled 2021-02-19: qty 200

## 2021-02-19 NOTE — Patient Instructions (Signed)

## 2021-02-21 ENCOUNTER — Inpatient Hospital Stay: Payer: BC Managed Care – PPO

## 2021-02-21 ENCOUNTER — Other Ambulatory Visit: Payer: Self-pay

## 2021-02-21 VITALS — BP 123/72 | HR 81 | Temp 98.8°F | Resp 20 | Wt 201.2 lb

## 2021-02-21 DIAGNOSIS — N920 Excessive and frequent menstruation with regular cycle: Secondary | ICD-10-CM | POA: Diagnosis not present

## 2021-02-21 DIAGNOSIS — Z9884 Bariatric surgery status: Secondary | ICD-10-CM | POA: Diagnosis not present

## 2021-02-21 DIAGNOSIS — D509 Iron deficiency anemia, unspecified: Secondary | ICD-10-CM | POA: Diagnosis not present

## 2021-02-21 DIAGNOSIS — D500 Iron deficiency anemia secondary to blood loss (chronic): Secondary | ICD-10-CM

## 2021-02-21 MED ORDER — SODIUM CHLORIDE 0.9 % IV SOLN: Freq: Once | INTRAVENOUS | Status: AC

## 2021-02-21 MED ORDER — SODIUM CHLORIDE 0.9 % IV SOLN
200.0000 mg | Freq: Once | INTRAVENOUS | Status: AC
  Administered 2021-02-21: 200 mg via INTRAVENOUS
  Filled 2021-02-21: qty 200

## 2021-02-21 NOTE — Patient Instructions (Signed)

## 2021-03-04 DIAGNOSIS — Z20822 Contact with and (suspected) exposure to covid-19: Secondary | ICD-10-CM | POA: Diagnosis not present

## 2021-05-08 NOTE — Progress Notes (Incomplete)
? ?[facility]  ?[address] ?[place],  [place]  [address] ?[phone] ? ?Clinic Day:  05/15/2021 ? ?Referring physician: Philemon Kingdom MD ? ?This document serves as a record of services personally performed by Weston Settle, MD. It was created on their behalf by Curry,Lauren E, a trained medical scribe. The creation of this record is based on the scribe's personal observations and the provider's statements to them. ? ?HISTORY OF PRESENT ILLNESS:  ?The patient is a 41 y.o. female  who I recently began seeing for iron deficiency anemia.  Recent iron studies showed a low ferritin of 6, a low serum iron of 25, a TIBC of 487, and a low iron saturation of 5%. The patient claims to have menstrual cycles which last 5 days, many of which are associated with clots.  She is scheduled for a Pap smear/pelvic exam later this month.  Of note, this patient gastric sleeve surgery in 2017.  She also has underwent duodenal switch surgery in 2019.  She has known about her iron deficiency anemia.  She was given IV iron in December 2021.  She currently has cravings for both corn and ice.  She claims to be taking a bariatric vitamin which contains iron.   ? ?PHYSICAL EXAM:  ?unknown if currently breastfeeding. ?Wt Readings from Last 3 Encounters:  ?02/21/21 201 lb 2.6 oz (91.2 kg)  ?02/19/21 204 lb (92.5 kg)  ?02/15/21 204 lb (92.5 kg)  ? ?There is no height or weight on file to calculate BMI. ?Performance status (ECOG): 0 - Asymptomatic ?Physical Exam ?Constitutional:   ?   Appearance: Normal appearance. She is not ill-appearing.  ?HENT:  ?   Mouth/Throat:  ?   Mouth: Mucous membranes are moist.  ?   Pharynx: Oropharynx is clear. No oropharyngeal exudate or posterior oropharyngeal erythema.  ?Cardiovascular:  ?   Rate and Rhythm: Normal rate and regular rhythm.  ?   Heart sounds: No murmur heard. ?  No friction rub. No gallop.  ?Pulmonary:  ?   Effort: Pulmonary effort is normal. No  respiratory distress.  ?   Breath sounds: Normal breath sounds. No wheezing, rhonchi or rales.  ?Abdominal:  ?   General: Bowel sounds are normal. There is no distension.  ?   Palpations: Abdomen is soft. There is no mass.  ?   Tenderness: There is no abdominal tenderness.  ?Musculoskeletal:     ?   General: No swelling.  ?   Right lower leg: No edema.  ?   Left lower leg: No edema.  ?Lymphadenopathy:  ?   Cervical: No cervical adenopathy.  ?   Upper Body:  ?   Right upper body: No supraclavicular or axillary adenopathy.  ?   Left upper body: No supraclavicular or axillary adenopathy.  ?   Lower Body: No right inguinal adenopathy. No left inguinal adenopathy.  ?Skin: ?   General: Skin is warm.  ?   Coloration: Skin is not jaundiced.  ?   Findings: No lesion or rash.  ?Neurological:  ?   General: No focal deficit present.  ?   Mental Status: She is alert and oriented to person, place, and time. Mental status is at baseline.  ?Psychiatric:     ?   Mood and Affect: Mood normal.     ?   Behavior: Behavior normal.     ?   Thought Content: Thought content normal.  ? ? ?LABS:  ? ? Latest  Reference Range & Units 02/07/21 00:00  ?WBC  7.6 (E)  ?RBC 3.87 - 5.11  4.41 (E)  ?Hemoglobin 12.0 - 16.0  9.6 ! (E)  ?HCT 36 - 46  31 ! (E)  ?Platelets 150 - 399  327 (E)  ?!: Data is abnormal ?(E): External lab result ? Latest Reference Range & Units 02/07/21 14:52 02/07/21 14:55  ?Iron 28 - 170 ug/dL 28   ?UIBC ug/dL 102   ?TIBC 250 - 450 ug/dL 585 (H)   ?Saturation Ratios 10.4 - 31.8 % 5 (L)   ?Ferritin 11 - 307 ng/mL  4 (L)  ?Folate >5.9 ng/mL 8.5   ?Vitamin B12 180 - 914 pg/mL 228   ?(H): Data is abnormally high ?(L): Data is abnormally low ? ?CMP Latest Ref Rng & Units 06/03/2014  ?Glucose 70 - 99 mg/dL 277(O)  ?BUN 6 - 23 mg/dL <2(U)  ?Creatinine 0.50 - 1.10 mg/dL 2.35  ?Sodium 135 - 145 mmol/L 136  ?Potassium 3.5 - 5.1 mmol/L 3.5  ?Chloride 96 - 112 mmol/L 106  ?CO2 19 - 32 mmol/L 26  ?Calcium 8.4 - 10.5 mg/dL 8.8  ? ?ASSESSMENT &  PLAN:  ?A 41 y.o. female whose labs today clearly reflect the presence of iron deficiency anemia.  The patient understands this is likely related to her history of her gastric/duodenal surgery, as well as her heavy menstrual cycles.  I will arrange for her to receive IV iron over these next few weeks to rapidly replenish her iron stores and normalize her hemoglobin.  As mentioned previously, she is scheduled for a Pap smear/pelvic exam in the forthcoming weeks, for which I am in complete agreement.  Otherwise, I will see her back in 3 months to reassess her iron and hemoglobin levels to see how well she responded to her upcoming IV iron.  The patient understands all the plans discussed today and is in agreement with them. ? ? ?I, Foye Deer, am acting as scribe for Weston Settle, MD   ? ?I have reviewed this report as typed by the medical scribe, and it is complete and accurate. ? ?Dequincy Kirby Funk, MD ? ? ? ?  ? ?

## 2021-05-15 ENCOUNTER — Inpatient Hospital Stay: Payer: BC Managed Care – PPO | Attending: Oncology

## 2021-05-15 ENCOUNTER — Encounter: Payer: Self-pay | Admitting: Hematology and Oncology

## 2021-05-15 ENCOUNTER — Inpatient Hospital Stay (INDEPENDENT_AMBULATORY_CARE_PROVIDER_SITE_OTHER): Payer: BC Managed Care – PPO | Admitting: Hematology and Oncology

## 2021-05-15 ENCOUNTER — Other Ambulatory Visit: Payer: Self-pay

## 2021-05-15 DIAGNOSIS — D509 Iron deficiency anemia, unspecified: Secondary | ICD-10-CM | POA: Insufficient documentation

## 2021-05-15 DIAGNOSIS — D500 Iron deficiency anemia secondary to blood loss (chronic): Secondary | ICD-10-CM

## 2021-05-15 DIAGNOSIS — D539 Nutritional anemia, unspecified: Secondary | ICD-10-CM

## 2021-05-15 LAB — CBC AND DIFFERENTIAL
HCT: 40 (ref 36–46)
Hemoglobin: 13.1 (ref 12.0–16.0)
Neutrophils Absolute: 3.25
Platelets: 324 10*3/uL (ref 150–400)
WBC: 5.5

## 2021-05-15 LAB — FERRITIN: Ferritin: 6 ng/mL — ABNORMAL LOW (ref 11–307)

## 2021-05-15 LAB — IRON AND TIBC
Iron: 53 ug/dL (ref 28–170)
Saturation Ratios: 11 % (ref 10.4–31.8)
TIBC: 472 ug/dL — ABNORMAL HIGH (ref 250–450)
UIBC: 419 ug/dL

## 2021-05-15 LAB — CBC: RBC: 4.91 (ref 3.87–5.11)

## 2021-05-15 NOTE — Assessment & Plan Note (Signed)
A 41 y.o. female whose labs clearly reflect the presence of iron deficiency anemia.  The patient understands this is likely related to her history of her gastric/duodenal surgery, as well as her heavy menstrual cycles. She received iron in the form of Venofer and tolerated well. Today, labs reveal a very good response with hemoglobin up to 13.1 today. She has been evaluated by her gynecologist regarding heavy periods. We will continue to evaluate and she will return to clinic in 3 months.  ?

## 2021-05-15 NOTE — Progress Notes (Cosign Needed)
?Patient Care Team: ?Philemon Kingdom, MD as PCP - General (Internal Medicine) ? ?Clinic Day:  05/15/2021 ? ?Referring physician: Philemon Kingdom, MD ? ?ASSESSMENT & PLAN:  ? ?Assessment & Plan: ?Iron deficiency anemia, unspecified ?A 41 y.o. female whose labs clearly reflect the presence of iron deficiency anemia.  The patient understands this is likely related to her history of her gastric/duodenal surgery, as well as her heavy menstrual cycles. She received iron in the form of Venofer and tolerated well. Today, labs reveal a very good response with hemoglobin up to 13.1 today. She has been evaluated by her gynecologist regarding heavy periods. We will continue to evaluate and she will return to clinic in 3 months.  ?  ? ?The patient understands the plans discussed today and is in agreement with them.  She knows to contact our office if she develops concerns prior to her next appointment. ? ? ? ?Pascal Lux, NP  ?[facility] ?[facility] ?[address] ?[place] [address] ?Dept: [phone] ?Dept Fax: [phone]  ? ?Orders Placed This Encounter  ?Procedures  ? Iron and TIBC  ?  ? ? ?CHIEF COMPLAINT:  ?CC: A 41 year old female with history of iron deficiency anemia here for 3 month evaluation ? ?Current Treatment:  Surveillance ? ?INTERVAL HISTORY:  ?Amber Hunt is here today for repeat clinical assessment. She denies fevers or chills. She denies pain. Her appetite is good. Her weight has been stable. ? ?I have reviewed the past medical history, past surgical history, social history and family history with the patient and they are unchanged from previous note. ? ?ALLERGIES:  has No Known Allergies. ? ?MEDICATIONS:  ?Current Outpatient Medications  ?Medication Sig Dispense Refill  ? ferrous sulfate 325 (65 FE) MG tablet Take 325 mg by mouth daily with breakfast.    ? Vitamin D, Ergocalciferol, (DRISDOL) 1.25 MG (50000 UNIT) CAPS capsule Take 50,000  Units by mouth once a week.    ? ?No current facility-administered medications for this visit.  ? ? ?HISTORY OF PRESENT ILLNESS:  ? ?Oncology History  ? No history exists.  ?  ? ? ?REVIEW OF SYSTEMS:  ? ?Constitutional: Denies fevers, chills or abnormal weight loss ?Eyes: Denies blurriness of vision ?Ears, nose, mouth, throat, and face: Denies mucositis or sore throat ?Respiratory: Denies cough, dyspnea or wheezes ?Cardiovascular: Denies palpitation, chest discomfort or lower extremity swelling ?Gastrointestinal:  Denies nausea, heartburn or change in bowel habits ?Skin: Denies abnormal skin rashes ?Lymphatics: Denies new lymphadenopathy or easy bruising ?Neurological:Denies numbness, tingling or new weaknesses ?Behavioral/Psych: Mood is stable, no new changes  ?All other systems were reviewed with the patient and are negative. ? ? ?VITALS:  ?Blood pressure 139/79, pulse 67, temperature 98.7 ?F (37.1 ?C), resp. rate 16, height 5\' 5"  (1.651 m), weight 204 lb 9.6 oz (92.8 kg), SpO2 99 %, unknown if currently breastfeeding.  ?Wt Readings from Last 3 Encounters:  ?05/15/21 204 lb 9.6 oz (92.8 kg)  ?02/21/21 201 lb 2.6 oz (91.2 kg)  ?02/19/21 204 lb (92.5 kg)  ?  ?Body mass index is 34.05 kg/m?. ? ?Performance status (ECOG): 1 - Symptomatic but completely ambulatory ? ?PHYSICAL EXAM:  ? ?GENERAL:alert, no distress and comfortable ?SKIN: skin color, texture, turgor are normal, no rashes or significant lesions ?EYES: normal, Conjunctiva are pink and non-injected, sclera clear ?OROPHARYNX:no exudate, no erythema and lips, buccal mucosa, and tongue normal  ?NECK: supple, thyroid normal size, non-tender, without nodularity ?LYMPH:  no palpable  lymphadenopathy in the cervical, axillary or inguinal ?LUNGS: clear to auscultation and percussion with normal breathing effort ?HEART: regular rate & rhythm and no murmurs and no lower extremity edema ?ABDOMEN:abdomen soft, non-tender and normal bowel sounds ?Musculoskeletal:no  cyanosis of digits and no clubbing  ?NEURO: alert & oriented x 3 with fluent speech, no focal motor/sensory deficits ? ?LABORATORY DATA:  ?I have reviewed the data as listed ?   ?Component Value Date/Time  ? NA 136 06/03/2014 0620  ? K 3.5 06/03/2014 0620  ? CL 106 06/03/2014 0620  ? CO2 26 06/03/2014 0620  ? GLUCOSE 109 (H) 06/03/2014 1601  ? BUN <5 (L) 06/03/2014 0932  ? CREATININE 0.68 06/03/2014 0620  ? CALCIUM 8.8 06/03/2014 0620  ? GFRNONAA >90 06/03/2014 0620  ? GFRAA >90 06/03/2014 0620  ? ? ?No results found for: SPEP, UPEP ? ?Lab Results  ?Component Value Date  ? WBC 5.5 05/15/2021  ? NEUTROABS 3.25 05/15/2021  ? HGB 13.1 05/15/2021  ? HCT 40 05/15/2021  ? MCV 84.3 06/03/2014  ? PLT 324 05/15/2021  ? ? ?  Chemistry   ?   ?Component Value Date/Time  ? NA 136 06/03/2014 0620  ? K 3.5 06/03/2014 0620  ? CL 106 06/03/2014 0620  ? CO2 26 06/03/2014 0620  ? BUN <5 (L) 06/03/2014 3557  ? CREATININE 0.68 06/03/2014 0620  ?    ?Component Value Date/Time  ? CALCIUM 8.8 06/03/2014 0620  ?  ? ? ? ?RADIOGRAPHIC STUDIES: ?I have personally reviewed the radiological images as listed and agreed with the findings in the report. ?No results found. ?

## 2021-08-06 DIAGNOSIS — R059 Cough, unspecified: Secondary | ICD-10-CM | POA: Diagnosis not present

## 2021-08-06 DIAGNOSIS — R0981 Nasal congestion: Secondary | ICD-10-CM | POA: Diagnosis not present

## 2021-08-06 DIAGNOSIS — J209 Acute bronchitis, unspecified: Secondary | ICD-10-CM | POA: Diagnosis not present

## 2021-12-26 DIAGNOSIS — N925 Other specified irregular menstruation: Secondary | ICD-10-CM | POA: Diagnosis not present

## 2022-01-02 ENCOUNTER — Telehealth: Payer: Self-pay

## 2022-01-02 NOTE — Patient Outreach (Signed)
  Care Coordination   Initial Visit Note   01/02/2022 Name: Kloie Whiting MRN: [id number] DOB: 1981-01-28  Tahni Porchia is a 41 y.o. year old female who sees Prochnau, Chrys Racer, MD for primary care. I spoke with  Duard Brady by phone today.  What matters to the patients health and wellness today?  Placed  call to patient today and reviewed and offered [facility] care coordination program. Patient declines. Reminded patient of need for AWV and she reports it is not due yet.    SDOH assessments and interventions completed:  No     Care Coordination Interventions Activated:  No  Care Coordination Interventions:  No, not indicated   Follow up plan: No further intervention required.   Encounter Outcome:  Pt. Refused   Tomasa Rand, RN, BSN, CEN [facility] [employer] [phone]

## 2022-01-10 DIAGNOSIS — D509 Iron deficiency anemia, unspecified: Secondary | ICD-10-CM | POA: Diagnosis not present

## 2022-01-10 DIAGNOSIS — Z113 Encounter for screening for infections with a predominantly sexual mode of transmission: Secondary | ICD-10-CM | POA: Diagnosis not present

## 2022-01-10 DIAGNOSIS — Z3480 Encounter for supervision of other normal pregnancy, unspecified trimester: Secondary | ICD-10-CM | POA: Diagnosis not present

## 2022-01-10 DIAGNOSIS — Z6834 Body mass index (BMI) 34.0-34.9, adult: Secondary | ICD-10-CM | POA: Diagnosis not present

## 2022-01-10 DIAGNOSIS — N925 Other specified irregular menstruation: Secondary | ICD-10-CM | POA: Diagnosis not present

## 2022-01-10 LAB — OB RESULTS CONSOLE ANTIBODY SCREEN: Antibody Screen: NEGATIVE

## 2022-01-10 LAB — OB RESULTS CONSOLE HIV ANTIBODY (ROUTINE TESTING): HIV: NONREACTIVE

## 2022-01-10 LAB — OB RESULTS CONSOLE GC/CHLAMYDIA
Chlamydia: NEGATIVE
Neisseria Gonorrhea: NEGATIVE

## 2022-01-10 LAB — OB RESULTS CONSOLE RUBELLA ANTIBODY, IGM: Rubella: IMMUNE

## 2022-01-10 LAB — OB RESULTS CONSOLE HEPATITIS B SURFACE ANTIGEN: Hepatitis B Surface Ag: NEGATIVE

## 2022-01-10 LAB — OB RESULTS CONSOLE RPR: RPR: NONREACTIVE

## 2022-01-10 LAB — HEPATITIS C ANTIBODY: HCV Ab: NEGATIVE

## 2022-01-29 ENCOUNTER — Non-Acute Institutional Stay (HOSPITAL_COMMUNITY)
Admission: RE | Admit: 2022-01-29 | Discharge: 2022-01-29 | Disposition: A | Discharge status: Home / Self Care | Payer: BC Managed Care – PPO | Source: Ambulatory Visit | Attending: Internal Medicine | Admitting: Internal Medicine

## 2022-01-29 DIAGNOSIS — O99019 Anemia complicating pregnancy, unspecified trimester: Secondary | ICD-10-CM | POA: Diagnosis not present

## 2022-01-29 MED ORDER — SODIUM CHLORIDE 0.9 % IV SOLN
INTRAVENOUS | Status: DC | PRN
Start: 2022-01-29 — End: 2022-01-30

## 2022-01-29 MED ORDER — SODIUM CHLORIDE 0.9 % IV SOLN
510.0000 mg | Freq: Once | INTRAVENOUS | Status: AC
  Administered 2022-01-29: 510 mg via INTRAVENOUS
  Filled 2022-01-29: qty 17

## 2022-01-29 NOTE — Progress Notes (Signed)
PATIENT CARE CENTER NOTE  Diagnosis:  ICD-10: 099.019: Anemia complicating pregnancy, unspecified trimester   Provider: Philip Aspen DO  Procedure: Feraheme 510 mg   Note: Patient received Feraheme infusion (dose #1 of 2) via PIV. Tolerated infusion well with no adverse reaction. Vital signs stable. Discharge instructions given. Patient observed for 30 minutes post infusion. Patient advised to schedule next appointment at front desk. Alert, oriented and ambulatory at discharge.

## 2022-02-03 DIAGNOSIS — O09511 Supervision of elderly primigravida, first trimester: Secondary | ICD-10-CM | POA: Diagnosis not present

## 2022-02-03 DIAGNOSIS — Z369 Encounter for antenatal screening, unspecified: Secondary | ICD-10-CM | POA: Diagnosis not present

## 2022-02-05 ENCOUNTER — Non-Acute Institutional Stay (HOSPITAL_COMMUNITY)
Admission: RE | Admit: 2022-02-05 | Discharge: 2022-02-05 | Disposition: A | Discharge status: Home / Self Care | Payer: BC Managed Care – PPO | Source: Ambulatory Visit | Attending: Internal Medicine | Admitting: Internal Medicine

## 2022-02-05 DIAGNOSIS — O99019 Anemia complicating pregnancy, unspecified trimester: Secondary | ICD-10-CM | POA: Insufficient documentation

## 2022-02-05 MED ORDER — SODIUM CHLORIDE 0.9 % IV SOLN
INTRAVENOUS | Status: DC | PRN
Start: 2022-02-05 — End: 2022-02-06

## 2022-02-05 MED ORDER — SODIUM CHLORIDE 0.9 % IV SOLN
510.0000 mg | Freq: Once | INTRAVENOUS | Status: AC
  Administered 2022-02-05: 510 mg via INTRAVENOUS
  Filled 2022-02-05: qty 17

## 2022-02-05 NOTE — Progress Notes (Signed)
PATIENT CARE CENTER NOTE:  Provider: Philip Aspen DO  Diagnosis: anemia  Procedure: Feraheme 510mg  infusion   Patient received IV Feraheme ( dose  #2 of 2). No premeds required per orders. Observed for at least 30 minutes post infusion. Tolerated well, vitals stable. Pt declined AVS.Patient alert, oriented, and ambulatory at the time of discharge.

## 2022-02-12 DIAGNOSIS — Z369 Encounter for antenatal screening, unspecified: Secondary | ICD-10-CM | POA: Diagnosis not present

## 2022-02-27 DIAGNOSIS — N760 Acute vaginitis: Secondary | ICD-10-CM | POA: Diagnosis not present

## 2022-02-27 DIAGNOSIS — Z369 Encounter for antenatal screening, unspecified: Secondary | ICD-10-CM | POA: Diagnosis not present

## 2022-03-03 NOTE — L&D Delivery Note (Signed)
Delivery Note At 12:19 PM a viable and healthy female was delivered via Vaginal, Spontaneous (Presentation: Right Occiput Anterior).  APGAR: 8, 9; weight pending .   Placenta status: Spontaneous, Intact.  Cord: 3 vessels without loose nuchal cord that was reduced at the perineum.  The patient pushed with contractions and delivered a vigorous female infant in the vertex ROA presentation with Apgar scores of 8 at 1 minute and 9 at 5 minutes.  Delivery of the infant's head was nuchal cord was noted and reduced perineum.  Following delivery of the infant was passed to the waiting abdomen.  Following a 1 minute delay, the cord was clamped and cut.  The placenta then delivered spontaneously, intact, with three-vessel cord.  A first-degree vaginal laceration was repaired with 3-0 Vicryl.  All sponge, needle, instrument counts were correct.  Mom and baby are doing well following delivery.  Anesthesia: Epidural Episiotomy: None Lacerations: 1st degree;Vaginal Suture Repair: 3.0 vicryl Est. Blood Loss (mL): 227  Mom to postpartum.  Baby to Couplet care / Skin to Skin.  Waynard Reeds 08/14/2022, 12:36 PM

## 2022-03-06 DIAGNOSIS — Z8759 Personal history of other complications of pregnancy, childbirth and the puerperium: Secondary | ICD-10-CM | POA: Diagnosis not present

## 2022-03-06 DIAGNOSIS — Z3A15 15 weeks gestation of pregnancy: Secondary | ICD-10-CM | POA: Diagnosis not present

## 2022-03-06 DIAGNOSIS — Z3480 Encounter for supervision of other normal pregnancy, unspecified trimester: Secondary | ICD-10-CM | POA: Diagnosis not present

## 2022-03-06 DIAGNOSIS — Z23 Encounter for immunization: Secondary | ICD-10-CM | POA: Diagnosis not present

## 2022-04-02 DIAGNOSIS — Z3A19 19 weeks gestation of pregnancy: Secondary | ICD-10-CM | POA: Diagnosis not present

## 2022-04-02 DIAGNOSIS — Z363 Encounter for antenatal screening for malformations: Secondary | ICD-10-CM | POA: Diagnosis not present

## 2022-04-16 DIAGNOSIS — Z8759 Personal history of other complications of pregnancy, childbirth and the puerperium: Secondary | ICD-10-CM | POA: Diagnosis not present

## 2022-04-16 DIAGNOSIS — Z363 Encounter for antenatal screening for malformations: Secondary | ICD-10-CM | POA: Diagnosis not present

## 2022-04-16 DIAGNOSIS — Z3A21 21 weeks gestation of pregnancy: Secondary | ICD-10-CM | POA: Diagnosis not present

## 2022-05-13 DIAGNOSIS — Z3480 Encounter for supervision of other normal pregnancy, unspecified trimester: Secondary | ICD-10-CM | POA: Diagnosis not present

## 2022-05-13 DIAGNOSIS — Z369 Encounter for antenatal screening, unspecified: Secondary | ICD-10-CM | POA: Diagnosis not present

## 2022-05-29 ENCOUNTER — Ambulatory Visit (INDEPENDENT_AMBULATORY_CARE_PROVIDER_SITE_OTHER): Payer: BC Managed Care – PPO

## 2022-05-29 ENCOUNTER — Ambulatory Visit: Payer: BC Managed Care – PPO | Attending: Cardiology | Admitting: Cardiology

## 2022-05-29 ENCOUNTER — Encounter: Payer: Self-pay | Admitting: Cardiology

## 2022-05-29 VITALS — BP 118/64 | HR 77 | Ht 65.0 in | Wt 221.6 lb

## 2022-05-29 DIAGNOSIS — Z3A29 29 weeks gestation of pregnancy: Secondary | ICD-10-CM

## 2022-05-29 DIAGNOSIS — I10 Essential (primary) hypertension: Secondary | ICD-10-CM | POA: Diagnosis not present

## 2022-05-29 DIAGNOSIS — O09529 Supervision of elderly multigravida, unspecified trimester: Secondary | ICD-10-CM

## 2022-05-29 DIAGNOSIS — R002 Palpitations: Secondary | ICD-10-CM | POA: Diagnosis not present

## 2022-05-29 NOTE — Patient Instructions (Signed)
Medication Instructions:  Your physician recommends that you continue on your current medications as directed. Please refer to the Current Medication list given to you today.   *If you need a refill on your cardiac medications before your next appointment, please call your pharmacy*   Lab Work: Your provider has ordered lab work today.  CMET Mg2+  If you have labs (blood work) drawn today and your tests are completely normal, you will receive your results only by: [place] (if you have MyChart) OR A paper copy in the mail If you have any lab test that is abnormal or we need to change your treatment, we will call you to review the results.   Testing/Procedures: [place] [place]- Long Term Monitor Instructions  Your physician has requested you wear a ZIO patch monitor for 14 days.  This is a single patch monitor. [employer] supplies one patch monitor per enrollment. Additional stickers are not available. Please do not apply patch if you will be having a Nuclear Stress Test,  Echocardiogram, Cardiac CT, MRI, or Chest Xray during the period you would be wearing the  monitor. The patch cannot be worn during these tests. You cannot remove and re-apply the  ZIO XT patch monitor.  Your ZIO patch monitor will be mailed 3 day USPS to your address on file. It may take 3-5 days  to receive your monitor after you have been enrolled.  Once you have received your monitor, please review the enclosed instructions. Your monitor  has already been registered assigning a specific monitor serial # to you.  Billing and Patient Assistance Program Information  We have supplied [employer] with any of your insurance information on file for billing purposes. [employer] offers a sliding scale Patient Assistance Program for patients that do not have  insurance, or whose insurance does not completely cover the cost of the ZIO monitor.  You must apply for the Patient Assistance Program to qualify for this discounted rate.   To apply, please call [employer] at [phone], select option 4, select option 2, ask to apply for  Patient Assistance Program. Theodore Demark will ask your household income, and how many people  are in your household. They will quote your out-of-pocket cost based on that information.  [employer] will also be able to set up a 9-month, interest-free payment plan if needed.  Applying the monitor   Shave hair from upper left chest.  Hold abrader disc by orange tab. Rub abrader in 40 strokes over the upper left chest as  indicated in your monitor instructions.  Clean area with 4 enclosed alcohol pads. Let dry.  Apply patch as indicated in monitor instructions. Patch will be placed under collarbone on left  side of chest with arrow pointing upward.  Rub patch adhesive wings for 2 minutes. Remove white label marked "1". Remove the white  label marked "2". Rub patch adhesive wings for 2 additional minutes.  While looking in a mirror, press and release button in center of patch. A small green light will  flash 3-4 times. This will be your only indicator that the monitor has been turned on.  Do not shower for the first 24 hours. You may shower after the first 24 hours.  Press the button if you feel a symptom. You will hear a small click. Record Date, Time and  Symptom in the Patient Logbook.  When you are ready to remove the patch, follow instructions on the last 2 pages of Patient  Logbook. Stick patch monitor onto  the last page of Patient Logbook.  Place Patient Logbook in the blue and white box. Use locking tab on box and tape box closed  securely. The blue and white box has prepaid postage on it. Please place it in the mailbox as  soon as possible. Your physician should have your test results approximately 7 days after the  monitor has been mailed back to [facility].  Call [place] at [phone] if you have questions regarding  your ZIO XT patch monitor. Call them immediately  if you see an orange light blinking on your  monitor.  If your monitor falls off in less than 4 days, contact our Monitor department at [phone].  If your monitor becomes loose or falls off after 4 days call [employer] at [phone] for  suggestions on securing your monitor    Follow-Up: At [facility], you and your health needs are our priority.  As part of our continuing mission to provide you with exceptional heart care, we have created designated Provider Care Teams.  These Care Teams include your primary Cardiologist (physician) and Advanced Practice Providers (APPs -  Physician Assistants and Nurse Practitioners) who all work together to provide you with the care you need, when you need it.  We recommend signing up for the patient portal called "MyChart".  Sign up information is provided on this After Visit Summary.  MyChart is used to connect with patients for Virtual Visits (Telemedicine).  Patients are able to view lab/test results, encounter notes, upcoming appointments, etc.  Non-urgent messages can be sent to your provider as well.   To learn more about what you can do with MyChart, go to [email or web address].    Your next appointment:   6 week(s)  Provider:   Berniece Salines, DO   Other Instructions Please take your blood pressure daily for 2 weeks and send in a MyChart message. Please include heart rates.   HOW TO TAKE YOUR BLOOD PRESSURE: Rest 5 minutes before taking your blood pressure. Don't smoke or drink caffeinated beverages for at least 30 minutes before. Take your blood pressure before (not after) you eat. Sit comfortably with your back supported and both feet on the floor (don't cross your legs). Elevate your arm to heart level on a table or a desk. Use the proper sized cuff. It should fit smoothly and snugly around your bare upper arm. There should be enough room to slip a fingertip under the cuff. The bottom edge of the cuff should be 1 inch above  the crease of the elbow. Ideally, take 3 measurements at one sitting and record the average.

## 2022-05-29 NOTE — Progress Notes (Signed)
[facility]io[facility]st[facility]s [facility]  Follow Up Note   Date:  06/01/2022   ID:  Amber Hunt, DOB 12-Oct-1980, MRN [id number]  PCP:  Ernestene Kiel, MD   [facility] Cardiologist:  Berniece Salines, DO  Electrophysiologist:  None        Referring MD: Ernestene Kiel, MD   Chief Complaint: " I am having palpiations"  History of Present Illness:    Amber Hunt is a 42 y.o. female [G3P2012] who returns for follow up of hypertension.  Was taking off medications after she lost weight, gestational hypertension.  Was referred due to significant palpitations.  Patient tells me that she has had some palpitations in the past which related to her anemia.  But recently she had iron transfusion she does not have any noted anemia but has been experiencing abrupt onset of intermittent fast heart rate.  This is concerning because is not like her previous episodes.  Thankfully she has not passed out.  She is currently [redacted] weeks pregnant.   Prior CV Studies Reviewed: The following studies were reviewed today: None   Past Medical History:  Diagnosis Date   Hypertension     Past Surgical History:  Procedure Laterality Date   CHOLECYSTECTOMY     DILATION AND EVACUATION N/A 06/03/2014   Procedure: DILATATION AND EVACUATION;  Surgeon: Osborne Oman, MD;  Location: [facility];  Service: Gynecology;  Laterality: N/A;      OB History     Gravida  3   Para  2   Term  2   Preterm  0   AB  1   Living  2      SAB  1   IAB  0   Ectopic  0   Multiple  0   Live Births                  Current Medications: Current Meds  Medication Sig   aspirin EC 81 MG tablet Take 81 mg by mouth daily. Swallow whole.     Allergies:   Patient has no known allergies.   Social History   Socioeconomic History   Marital status: Married    Spouse name: Not on file   Number of children: Not on file   Years of education: Not on file   Highest education level: Not on file   Occupational History   Not on file  Tobacco Use   Smoking status: Former    Types: Cigarettes    Quit date: 06/03/2006    Years since quitting: 16.0   Smokeless tobacco: Not on file  Substance and Sexual Activity   Alcohol use: No   Drug use: Not on file   Sexual activity: Not on file  Other Topics Concern   Not on file  Social History Narrative   Not on file   Social Determinants of Health   Financial Resource Strain: Not on file  Food Insecurity: Not on file  Transportation Needs: Not on file  Physical Activity: Not on file  Stress: Not on file  Social Connections: Not on file      No family history on file.    ROS:   Please see the history of present illness.    palpitations All other systems reviewed and are negative.   Labs/EKG Reviewed:    EKG:   EKG is was ordered today.  The ekg ordered today demonstrates normal sinus rhythm, heart rate 77 bpm, cannot rule out old anterior infarction.  Recent  Labs: 05/29/2022: ALT 13; BUN 6; Creatinine, Ser 0.47; Magnesium 1.8; Potassium 3.9; Sodium 137   Recent Lipid Panel No results found for: "CHOL", "TRIG", "HDL", "CHOLHDL", "LDLCALC", "LDLDIRECT"  Physical Exam:    VS:  BP 118/64 (BP Location: Left Arm, Patient Position: Sitting, Cuff Size: Large)   Pulse 77   Ht 5\' 5"  (1.651 m)   Wt 221 lb 9.6 oz (100.5 kg)   SpO2 96%   BMI 36.88 kg/m     Wt Readings from Last 3 Encounters:  05/29/22 221 lb 9.6 oz (100.5 kg)  05/15/21 204 lb 9.6 oz (92.8 kg)  02/21/21 201 lb 2.6 oz (91.2 kg)     GEN:  Well nourished, well developed in no acute distress HEENT: Normal NECK: No JVD; No carotid bruits LYMPHATICS: No lymphadenopathy CARDIAC: RRR, no murmurs, rubs, gallops RESPIRATORY:  Clear to auscultation without rales, wheezing or rhonchi  ABDOMEN: Soft, non-tender, non-distended MUSCULOSKELETAL:  No edema; No deformity  SKIN: Warm and dry NEUROLOGIC:  Alert and oriented x 3 PSYCHIATRIC:  Normal affect    Risk  Assessment/Risk Calculators:                 ASSESSMENT & PLAN:    Palpitation History of hypertension Obesity pregnancy  I would like to rule out a cardiovascular etiology of this palpitation, therefore at this time I would like to placed a zio patch for  14 days.   I have asked the patient to take her blood pressure daily and send me updates in 2 weeks.  Agree with her aspirin for preeclampsia prophylaxis.  Fu in 6 weeks or sooner if needed  Patient Instructions  Medication Instructions:  Your physician recommends that you continue on your current medications as directed. Please refer to the Current Medication list given to you today.   *If you need a refill on your cardiac medications before your next appointment, please call your pharmacy*   Lab Work: Your provider has ordered lab work today.  CMET Mg2+  If you have labs (blood work) drawn today and your tests are completely normal, you will receive your results only by: [facility] (if you have MyChart) OR A paper copy in the mail If you have any lab test that is abnormal or we need to change your treatment, we will call you to review the results.   Testing/Procedures: [facility]- Long Term Monitor Instructions  Your physician has requested you wear a ZIO patch monitor for 14 days.  This is a single patch monitor. [employer] supplies one patch monitor per enrollment. Additional stickers are not available. Please do not apply patch if you will be having a Nuclear Stress Test,  Echocardiogram, Cardiac CT, MRI, or Chest Xray during the period you would be wearing the  monitor. The patch cannot be worn during these tests. You cannot remove and re-apply the  ZIO XT patch monitor.  Your ZIO patch monitor will be mailed 3 day USPS to your address on file. It may take 3-5 days  to receive your monitor after you have been enrolled.  Once you have received your monitor, please review the enclosed instructions. Your  monitor  has already been registered assigning a specific monitor serial # to you.  Billing and Patient Assistance Program Information  We have supplied [employer] with any of your insurance information on file for billing purposes. [employer] offers a sliding scale Patient Assistance Program for patients that do not have  insurance, or whose insurance does not  completely cover the cost of the ZIO monitor.  You must apply for the Patient Assistance Program to qualify for this discounted rate.  To apply, please call [employer] at [phone], select option 4, select option 2, ask to apply for  Patient Assistance Program. Theodore Demark will ask your household income, and how many people  are in your household. They will quote your out-of-pocket cost based on that information.  [employer] will also be able to set up a 29-month, interest-free payment plan if needed.  Applying the monitor   Shave hair from upper left chest.  Hold abrader disc by orange tab. Rub abrader in 40 strokes over the upper left chest as  indicated in your monitor instructions.  Clean area with 4 enclosed alcohol pads. Let dry.  Apply patch as indicated in monitor instructions. Patch will be placed under collarbone on left  side of chest with arrow pointing upward.  Rub patch adhesive wings for 2 minutes. Remove white label marked "1". Remove the white  label marked "2". Rub patch adhesive wings for 2 additional minutes.  While looking in a mirror, press and release button in center of patch. A small green light will  flash 3-4 times. This will be your only indicator that the monitor has been turned on.  Do not shower for the first 24 hours. You may shower after the first 24 hours.  Press the button if you feel a symptom. You will hear a small click. Record Date, Time and  Symptom in the Patient Logbook.  When you are ready to remove the patch, follow instructions on the last 2 pages of Patient  Logbook. Stick patch monitor onto the  last page of Patient Logbook.  Place Patient Logbook in the blue and white box. Use locking tab on box and tape box closed  securely. The blue and white box has prepaid postage on it. Please place it in the mailbox as  soon as possible. Your physician should have your test results approximately 7 days after the  monitor has been mailed back to [facility]  Call [facility] at [phone] if you have questions regarding  your ZIO XT patch monitor. Call them immediately if you see an orange light blinking on your  monitor.  If your monitor falls off in less than 4 days, contact our Monitor department at [phone].  If your monitor becomes loose or falls off after 4 days call [employer] at [phone] for  suggestions on securing your monitor    Follow-Up: At [facility], you and your health needs are our priority.  As part of our continuing mission to provide you with exceptional heart care, we have created designated Provider Care Teams.  These Care Teams include your primary Cardiologist (physician) and Advanced Practice Providers (APPs -  Physician Assistants and Nurse Practitioners) who all work together to provide you with the care you need, when you need it.  We recommend signing up for the patient portal called "MyChart".  Sign up information is provided on this After Visit Summary.  MyChart is used to connect with patients for Virtual Visits (Telemedicine).  Patients are able to view lab/test results, encounter notes, upcoming appointments, etc.  Non-urgent messages can be sent to your provider as well.   To learn more about what you can do with MyChart, go to [email or web address].    Your next appointment:   6 week(s)  Provider:   Berniece Salines, DO   Other Instructions Please take your blood  pressure daily for 2 weeks and send in a MyChart message. Please include heart rates.   HOW TO TAKE YOUR BLOOD PRESSURE: Rest 5 minutes before taking your  blood pressure. Don't smoke or drink caffeinated beverages for at least 30 minutes before. Take your blood pressure before (not after) you eat. Sit comfortably with your back supported and both feet on the floor (don't cross your legs). Elevate your arm to heart level on a table or a desk. Use the proper sized cuff. It should fit smoothly and snugly around your bare upper arm. There should be enough room to slip a fingertip under the cuff. The bottom edge of the cuff should be 1 inch above the crease of the elbow. Ideally, take 3 measurements at one sitting and record the average.    Dispo:  No follow-ups on file.   Medication Adjustments/Labs and Tests Ordered: Current medicines are reviewed at length with the patient today.  Concerns regarding medicines are outlined above.  Tests Ordered: Orders Placed This Encounter  Procedures   Comprehensive Metabolic Panel (CMET)   Magnesium   LONG TERM MONITOR (3-14 DAYS)   EKG 12-Lead   Medication Changes: No orders of the defined types were placed in this encounter.

## 2022-05-29 NOTE — Progress Notes (Unsigned)
Enrolled for [employer] to mail a ZIO XT long term holter monitor to the patients address on file.  

## 2022-05-30 LAB — COMPREHENSIVE METABOLIC PANEL
ALT: 13 IU/L (ref 0–32)
AST: 12 IU/L (ref 0–40)
Albumin/Globulin Ratio: 1.3 (ref 1.2–2.2)
Albumin: 3.4 g/dL — ABNORMAL LOW (ref 3.9–4.9)
Alkaline Phosphatase: 73 IU/L (ref 44–121)
BUN/Creatinine Ratio: 13 (ref 9–23)
BUN: 6 mg/dL (ref 6–24)
Bilirubin Total: 0.7 mg/dL (ref 0.0–1.2)
CO2: 21 mmol/L (ref 20–29)
Calcium: 8.8 mg/dL (ref 8.7–10.2)
Chloride: 104 mmol/L (ref 96–106)
Creatinine, Ser: 0.47 mg/dL — ABNORMAL LOW (ref 0.57–1.00)
Globulin, Total: 2.6 g/dL (ref 1.5–4.5)
Glucose: 86 mg/dL (ref 70–99)
Potassium: 3.9 mmol/L (ref 3.5–5.2)
Sodium: 137 mmol/L (ref 134–144)
Total Protein: 6 g/dL (ref 6.0–8.5)
eGFR: 122 mL/min/{1.73_m2} (ref >59)

## 2022-05-30 LAB — MAGNESIUM: Magnesium: 1.8 mg/dL (ref 1.6–2.3)

## 2022-06-02 DIAGNOSIS — Z23 Encounter for immunization: Secondary | ICD-10-CM | POA: Diagnosis not present

## 2022-06-03 DIAGNOSIS — R002 Palpitations: Secondary | ICD-10-CM

## 2022-06-03 DIAGNOSIS — I10 Essential (primary) hypertension: Secondary | ICD-10-CM

## 2022-06-05 ENCOUNTER — Inpatient Hospital Stay (HOSPITAL_COMMUNITY)
Admission: AD | Admit: 2022-06-05 | Discharge: 2022-06-05 | Disposition: A | Discharge status: Home / Self Care | Payer: BC Managed Care – PPO | Attending: Obstetrics and Gynecology | Admitting: Obstetrics and Gynecology

## 2022-06-05 ENCOUNTER — Encounter (HOSPITAL_COMMUNITY): Payer: Self-pay | Admitting: *Deleted

## 2022-06-05 DIAGNOSIS — O4693 Antepartum hemorrhage, unspecified, third trimester: Secondary | ICD-10-CM | POA: Diagnosis not present

## 2022-06-05 DIAGNOSIS — O09523 Supervision of elderly multigravida, third trimester: Secondary | ICD-10-CM | POA: Diagnosis not present

## 2022-06-05 DIAGNOSIS — O42913 Preterm premature rupture of membranes, unspecified as to length of time between rupture and onset of labor, third trimester: Secondary | ICD-10-CM | POA: Diagnosis not present

## 2022-06-05 DIAGNOSIS — Z3A28 28 weeks gestation of pregnancy: Secondary | ICD-10-CM | POA: Insufficient documentation

## 2022-06-05 DIAGNOSIS — N930 Postcoital and contact bleeding: Secondary | ICD-10-CM

## 2022-06-05 DIAGNOSIS — O4703 False labor before 37 completed weeks of gestation, third trimester: Secondary | ICD-10-CM | POA: Diagnosis not present

## 2022-06-05 HISTORY — DX: Obesity complicating pregnancy, unspecified trimester: O99.210

## 2022-06-05 HISTORY — DX: Palpitations: R00.2

## 2022-06-05 HISTORY — DX: Other specified postprocedural states: Z98.890

## 2022-06-05 HISTORY — DX: Anemia, unspecified: D64.9

## 2022-06-05 MED ORDER — NIFEDIPINE 10 MG PO CAPS
10.0000 mg | ORAL_CAPSULE | ORAL | Status: DC | PRN
  Administered 2022-06-05: 10 mg via ORAL
  Filled 2022-06-05: qty 1

## 2022-06-05 NOTE — Progress Notes (Signed)
Hansel Feinstein CNM in earlier to discuss d/c plan. Written and verbal d/c instructions given and undertstanding voiced

## 2022-06-05 NOTE — MAU Provider Note (Signed)
Chief Complaint:  Vaginal Bleeding   Event Date/Time   First Provider Initiated Contact with Patient 06/05/22 2210     HPI: Amber Hunt is a 42 y.o. [id number] at 32w5dwho presents to maternity admissions reporting vaginal bleeding about an hour ago after intercourse.  Feels some pressure in lower abdomen but no pain. . She denies LOF, urinary symptoms, h/a, dizziness, n/v, diarrhea, constipation or fever/chills.  Prenatal notes denote "normal ultrasound" and do not mention previa.  Vaginal Bleeding The patient's primary symptoms include vaginal bleeding. The patient's pertinent negatives include no genital itching, genital odor or pelvic pain (just pressure). This is a new problem. The current episode started today. She is pregnant. Pertinent negatives include no abdominal pain, chills, fever, nausea or vomiting. The vaginal discharge was bloody. The vaginal bleeding is lighter than menses. She has not been passing clots. She has not been passing tissue. Nothing aggravates the symptoms. She has tried nothing for the symptoms. She is sexually active.   Rn Note: Amber Hunt is a 42 y.o. at [redacted]w[redacted]d here in MAU reporting vag bleeding about an hour ago after intercourse. Baby has moved like normal all day but has not felt FM since noticing the bleeding. No pain but feels "fullness" in lower abdomen. Currently wearing heart monitor due to hx palpitations.  Onset of complaint: 1hr ago               Pain score: 0  Past Medical History: Past Medical History:  Diagnosis Date   Hypertension     Past obstetric history: OB History  Gravida Para Term Preterm AB Living  4 2 2  0 1 2  SAB IAB Ectopic Multiple Live Births  1 0 0 0      # Outcome Date GA Lbr Len/2nd Weight Sex Delivery Anes PTL Lv  4 Current           3 SAB 06/03/14 108w0d   U  None Y FD     Complications: Premature rupture of membranes in second trimester  2 Term 2010     Vag-Spont     1 Term 2004     Vag-Spont       Past Surgical  History: Past Surgical History:  Procedure Laterality Date   CHOLECYSTECTOMY     DILATION AND EVACUATION N/A 06/03/2014   Procedure: DILATATION AND EVACUATION;  Surgeon: Osborne Oman, MD;  Location: [facility];  Service: Gynecology;  Laterality: N/A;    Family History: No family history on file.  Social History: Social History   Tobacco Use   Smoking status: Former    Types: Cigarettes    Quit date: 06/03/2006    Years since quitting: 16.0  Substance Use Topics   Alcohol use: No    Allergies: No Known Allergies  Meds:  Medications Prior to Admission  Medication Sig Dispense Refill Last Dose   aspirin EC 81 MG tablet Take 81 mg by mouth daily. Swallow whole.   Past Week   ferrous sulfate 325 (65 FE) MG tablet Take 325 mg by mouth daily with breakfast. (Patient not taking: Reported on 05/29/2022)      Vitamin D, Ergocalciferol, (DRISDOL) 1.25 MG (50000 UNIT) CAPS capsule Take 50,000 Units by mouth once a week. (Patient not taking: Reported on 05/29/2022)       I have reviewed patient's Past Medical Hx, Surgical Hx, Family Hx, Social Hx, medications and allergies.   ROS:  Review of Systems  Constitutional:  Negative for chills and fever.  Gastrointestinal:  Negative for abdominal pain, nausea and vomiting.  Genitourinary:  Positive for vaginal bleeding. Negative for pelvic pain (just pressure).   Other systems negative  Physical Exam  Patient Vitals for the past 24 hrs:  BP Temp Pulse Resp SpO2 Height Weight  06/05/22 2146 129/80 -- -- -- -- -- --  06/05/22 2145 -- 98.1 F (36.7 C) 92 17 99 % 5\' 5"  (1.651 m) 102.1 kg   Constitutional: Well-developed, well-nourished female in no acute distress.  Cardiovascular: normal rate  Respiratory: normal effort GI: Abd soft, non-tender, gravid appropriate for gestational age.   No rebound or guarding. MS: Extremities nontender, no edema, normal ROM Neurologic: Alert and oriented x 4.  GU: Neg CVAT.  PELVIC EXAM: Cervix pink,  visually closed, without lesion, small to mod clotted dark red discharge, vaginal walls and external genitalia normal No active bleeding Dilation: Closed Effacement (%): Thick Exam by:: Wynelle Bourgeois CNM   FHT:  Baseline 140 , moderate variability, accelerations present, no decelerations Contractions: q 2-4 mins Irregular   Mild, not felt as painful to patient   Labs: No results found for this or any previous visit (from the past 24 hour(s)).  Blood type is B+  Imaging:  No results found.  MAU Course/MDM: I have reviewed the triage vital signs and the nursing notes.   Pertinent labs & imaging results that were available during my care of the patient were reviewed by me and considered in my medical decision making (see chart for details).      I have reviewed her medical records including past results, notes and treatments.    NST reviewed, reassuring for gestational age  Treatments in MAU included EFM, speculum exam, Procardia series for uterine contractions.  These diminished after one dose of Procardia.Earlyne [place] Dr Berton Lan, who does not think we need to give any more Procardia,  Monitor showed some irritability but patient does not feel any ot it.     Assessment: Single IUP at [redacted]w[redacted]d Post coital bleeding Preterm uterine contractions, not felt as painful by patient  Plan: Discharge home Preterm Labor and bleeding precautions and fetal kick counts Pelvic rest 1-2 weeks Follow up in Office for prenatal visits and recheck Encouraged to return if she develops worsening of symptoms, increase in pain, fever, or other concerning symptoms.   Pt stable at time of discharge.  Wynelle Bourgeois CNM, MSN Certified Nurse-Midwife 06/05/2022 10:10 PM

## 2022-06-05 NOTE — MAU Note (Addendum)
.  Amber Hunt is a 42 y.o. at [redacted]w[redacted]d here in MAU reporting vag bleeding about an hour ago after intercourse. Baby has moved like normal all day but has not felt FM since noticing the bleeding. No pain but feels "fullness" in lower abdomen. Currently wearing heart monitor due to hx palpitations.  Onset of complaint: 1hr ago Pain score: 0 Vitals:   06/05/22 2145 06/05/22 2146  BP:  129/80  Pulse: 92   Resp: 17   Temp: 98.1 F (36.7 C)   SpO2: 99%      FHT:140 Lab orders placed from triage:  none

## 2022-06-19 DIAGNOSIS — Z369 Encounter for antenatal screening, unspecified: Secondary | ICD-10-CM | POA: Diagnosis not present

## 2022-06-24 DIAGNOSIS — R002 Palpitations: Secondary | ICD-10-CM | POA: Diagnosis not present

## 2022-06-24 DIAGNOSIS — I10 Essential (primary) hypertension: Secondary | ICD-10-CM | POA: Diagnosis not present

## 2022-07-03 DIAGNOSIS — Z3A32 32 weeks gestation of pregnancy: Secondary | ICD-10-CM | POA: Diagnosis not present

## 2022-07-03 DIAGNOSIS — O09523 Supervision of elderly multigravida, third trimester: Secondary | ICD-10-CM | POA: Diagnosis not present

## 2022-07-10 ENCOUNTER — Encounter: Payer: Self-pay | Admitting: Cardiology

## 2022-07-10 ENCOUNTER — Ambulatory Visit: Payer: BC Managed Care – PPO | Attending: Cardiology | Admitting: Cardiology

## 2022-07-10 VITALS — BP 116/82 | HR 94 | Ht 65.0 in | Wt 229.0 lb

## 2022-07-10 DIAGNOSIS — O99210 Obesity complicating pregnancy, unspecified trimester: Secondary | ICD-10-CM | POA: Diagnosis not present

## 2022-07-10 DIAGNOSIS — O09529 Supervision of elderly multigravida, unspecified trimester: Secondary | ICD-10-CM

## 2022-07-10 DIAGNOSIS — Z3A34 34 weeks gestation of pregnancy: Secondary | ICD-10-CM | POA: Diagnosis not present

## 2022-07-10 DIAGNOSIS — I471 Supraventricular tachycardia, unspecified: Secondary | ICD-10-CM

## 2022-07-10 MED ORDER — METOPROLOL SUCCINATE ER 25 MG PO TB24: 12.5000 mg | ORAL_TABLET | Freq: Every day | ORAL | 3 refills | Status: AC

## 2022-07-10 NOTE — Patient Instructions (Addendum)
Medication Instructions:  Your physician has recommended you make the following change in your medication:  START: Metoprolol succinate (Toprol-XL) 12.5 mg once daily  Please send a update via MyChart in 2 weeks.  *If you need a refill on your cardiac medications before your next appointment, please call your pharmacy*   Lab Work: None   Testing/Procedures: None   Follow-Up: At [facility], you and your health needs are our priority.  As part of our continuing mission to provide you with exceptional heart care, we have created designated Provider Care Teams.  These Care Teams include your primary Cardiologist (physician) and Advanced Practice Providers (APPs -  Physician Assistants and Nurse Practitioners) who all work together to provide you with the care you need, when you need it.  We recommend signing up for the patient portal called "MyChart".  Sign up information is provided on this After Visit Summary.  MyChart is used to connect with patients for Virtual Visits (Telemedicine).  Patients are able to view lab/test results, encounter notes, upcoming appointments, etc.  Non-urgent messages can be sent to your provider as well.   To learn more about what you can do with MyChart, go to [email or web address].    Your next appointment:   12 week(s)  Provider:   Thomasene Ripple, DO

## 2022-07-13 NOTE — Progress Notes (Signed)
Cardio-Obstetrics Clinic  Follow Up Note   Date:  07/13/2022   ID:  Amber Hunt, DOB [birth year], MRN [id number]  PCP:  Philemon Kingdom, MD   [facility] Providers Cardiologist:  Thomasene Ripple, DO  Electrophysiologist:  None        Referring MD: Philemon Kingdom, MD   Chief Complaint: " I am having palpiations"  History of Present Illness:    Amber Hunt is a 42 y.o. female [G4P2012] who returns for follow up of hypertension.  Was taking off medications after she lost weight, gestational hypertension.  At her last visit due to her symptoms of palpitations and presyncope, I placed a monitor on the patient She wore the monitor. Here today to discuss the result. She is still experiencing palpitations and presyncope episodes.  She is currently [redacted] weeks pregnant.   Prior CV Studies Reviewed: The following studies were reviewed today: Zio monitor  Patch Wear Time:  13 days and 23 hours (2024-04-02T05:32:48-0400 to 2024-04-16T05:32:44-0400)   Patient had a min HR of 60 bpm, max HR of 231 bpm, and avg HR of 83 bpm. Predominant underlying rhythm was Sinus Rhythm.    13 Supraventricular Tachycardia runs occurred, the run with the fastest interval lasting 7.1 secs with a max rate of 231 bpm, the longest lasting 13.5 secs with an avg rate of 163 bpm. Isolated SVEs were rare (<1.0%), SVE Couplets were rare (<1.0%), and SVE Triplets were rare (<1.0%). Isolated VEs were rare (<1.0%, 69), VE Triplets were rare (<1.0%, 1), and no VE Couplets were present.    Symptom is associated with sinus rhythm.    Conclusion: This study shows evidence of paroxysmal supraventricular Tachycardia.   Past Medical History:  Diagnosis Date   Anemia    H/O LEEP    Hypertension    pt reports no elevated BP since weight loss following bariatric surgery   Obesity affecting pregnancy    Palpitations    currently wearing heart monitor for recurrent palpatations. applied tuesday and reports no  palpatations since tuesday    Past Surgical History:  Procedure Laterality Date   CHOLECYSTECTOMY     DILATION AND EVACUATION N/A 06/03/2014   Procedure: DILATATION AND EVACUATION;  Surgeon: Tereso Newcomer, MD;  Location: [facility];  Service: Gynecology;  Laterality: N/A;   LAPAROSCOPIC GASTRIC SLEEVE RESECTION     2017-then 2019 had a duodenal switch and hernia repair      OB History     Gravida  4   Para  2   Term  2   Preterm  0   AB  1   Living  2      SAB  1   IAB  0   Ectopic  0   Multiple  0   Live Births                  Current Medications: Current Meds  Medication Sig   aspirin EC 81 MG tablet Take 81 mg by mouth daily. Swallow whole.   metoprolol succinate (TOPROL XL) 25 MG 24 hr tablet Take 0.5 tablets (12.5 mg total) by mouth daily.     Allergies:   Patient has no known allergies.   Social History   Socioeconomic History   Marital status: Married    Spouse name: Not on file   Number of children: Not on file   Years of education: Not on file   Highest education level: Not on file  Occupational History   Not  on file  Tobacco Use   Smoking status: Former    Types: Cigarettes    Quit date: 06/03/2006    Years since quitting: 16.1   Smokeless tobacco: Not on file  Vaping Use   Vaping Use: Never used  Substance and Sexual Activity   Alcohol use: No   Drug use: Never   Sexual activity: Yes  Other Topics Concern   Not on file  Social History Narrative   Not on file   Social Determinants of Health   Financial Resource Strain: Not on file  Food Insecurity: Not on file  Transportation Needs: Not on file  Physical Activity: Not on file  Stress: Not on file  Social Connections: Not on file      Family History  Problem Relation Age of Onset   Cancer Father        deceased      ROS:   Please see the history of present illness.    palpitations All other systems reviewed and are negative.   Labs/EKG Reviewed:    EKG:    EKG is was ordered today.  The ekg ordered today demonstrates normal sinus rhythm, heart rate 77 bpm, cannot rule out old anterior infarction.  Recent Labs: 05/29/2022: ALT 13; BUN 6; Creatinine, Ser 0.47; Magnesium 1.8; Potassium 3.9; Sodium 137   Recent Lipid Panel No results found for: "CHOL", "TRIG", "HDL", "CHOLHDL", "LDLCALC", "LDLDIRECT"  Physical Exam:    VS:  BP 116/82   Pulse 94   Ht 5\' 5"  (1.651 m)   Wt 229 lb (103.9 kg)   SpO2 98%   BMI 38.11 kg/m     Wt Readings from Last 3 Encounters:  07/10/22 229 lb (103.9 kg)  06/05/22 225 lb (102.1 kg)  05/29/22 221 lb 9.6 oz (100.5 kg)     GEN:  Well nourished, well developed in no acute distress HEENT: Normal NECK: No JVD; No carotid bruits LYMPHATICS: No lymphadenopathy CARDIAC: RRR, no murmurs, rubs, gallops RESPIRATORY:  Clear to auscultation without rales, wheezing or rhonchi  ABDOMEN: Soft, non-tender, non-distended MUSCULOSKELETAL:  No edema; No deformity  SKIN: Warm and dry NEUROLOGIC:  Alert and oriented x 3 PSYCHIATRIC:  Normal affect    Risk Assessment/Risk Calculators:                 ASSESSMENT & PLAN:    PSVT History of hypertension Obesity pregnancy  She is still experiencing symptoms. It will be beneficial to start the patient on Toprol XL 2.5 mg daily.  Agree with her aspirin for preeclampsia prophylaxis.  Fu in 12 weeks or sooner if needed  Patient Instructions  Medication Instructions:  Your physician has recommended you make the following change in your medication:  START: Metoprolol succinate (Toprol-XL) 12.5 mg once daily  Please send a update via MyChart in 2 weeks.  *If you need a refill on your cardiac medications before your next appointment, please call your pharmacy*   Lab Work: None   Testing/Procedures: None   Follow-Up: At [facility], you and your health needs are our priority.  As part of our continuing mission to provide you with exceptional  heart care, we have created designated Provider Care Teams.  These Care Teams include your primary Cardiologist (physician) and Advanced Practice Providers (APPs -  Physician Assistants and Nurse Practitioners) who all work together to provide you with the care you need, when you need it.  We recommend signing up for the patient portal called "MyChart".  Sign up information is provided on this After Visit Summary.  MyChart is used to connect with patients for Virtual Visits (Telemedicine).  Patients are able to view lab/test results, encounter notes, upcoming appointments, etc.  Non-urgent messages can be sent to your provider as well.   To learn more about what you can do with MyChart, go to [email or web address].    Your next appointment:   12 week(s)  Provider:   Thomasene Ripple, DO    Dispo:  No follow-ups on file.   Medication Adjustments/Labs and Tests Ordered: Current medicines are reviewed at length with the patient today.  Concerns regarding medicines are outlined above.  Tests Ordered: No orders of the defined types were placed in this encounter.  Medication Changes: Meds ordered this encounter  Medications   metoprolol succinate (TOPROL XL) 25 MG 24 hr tablet    Sig: Take 0.5 tablets (12.5 mg total) by mouth daily.    Dispense:  45 tablet    Refill:  3

## 2022-07-15 DIAGNOSIS — Z369 Encounter for antenatal screening, unspecified: Secondary | ICD-10-CM | POA: Diagnosis not present

## 2022-07-23 DIAGNOSIS — Z369 Encounter for antenatal screening, unspecified: Secondary | ICD-10-CM | POA: Diagnosis not present

## 2022-07-23 DIAGNOSIS — O09523 Supervision of elderly multigravida, third trimester: Secondary | ICD-10-CM | POA: Diagnosis not present

## 2022-07-23 DIAGNOSIS — Z3480 Encounter for supervision of other normal pregnancy, unspecified trimester: Secondary | ICD-10-CM | POA: Diagnosis not present

## 2022-07-23 DIAGNOSIS — Z8639 Personal history of other endocrine, nutritional and metabolic disease: Secondary | ICD-10-CM | POA: Diagnosis not present

## 2022-07-23 LAB — OB RESULTS CONSOLE GBS: GBS: NEGATIVE

## 2022-07-24 ENCOUNTER — Encounter: Payer: Self-pay | Admitting: Cardiology

## 2022-07-31 ENCOUNTER — Non-Acute Institutional Stay (HOSPITAL_COMMUNITY)
Admission: RE | Admit: 2022-07-31 | Discharge: 2022-07-31 | Disposition: A | Discharge status: Home / Self Care | Payer: BC Managed Care – PPO | Source: Ambulatory Visit | Attending: Internal Medicine | Admitting: Internal Medicine

## 2022-07-31 DIAGNOSIS — D509 Iron deficiency anemia, unspecified: Secondary | ICD-10-CM | POA: Diagnosis not present

## 2022-07-31 DIAGNOSIS — Z3A37 37 weeks gestation of pregnancy: Secondary | ICD-10-CM | POA: Diagnosis not present

## 2022-07-31 DIAGNOSIS — O09523 Supervision of elderly multigravida, third trimester: Secondary | ICD-10-CM | POA: Diagnosis not present

## 2022-07-31 MED ORDER — IRON SUCROSE 500 MG IVPB - SIMPLE MED
500.0000 mg | Freq: Once | INTRAVENOUS | Status: AC
  Administered 2022-07-31: 500 mg via INTRAVENOUS
  Filled 2022-07-31: qty 500

## 2022-07-31 MED ORDER — SODIUM CHLORIDE 0.9 % IV SOLN: INTRAVENOUS | Status: DC | PRN

## 2022-07-31 NOTE — Progress Notes (Signed)
PATIENT CARE CENTER NOTE   Diagnosis: Iron deficiency anemia, unspecified D50.9   Provider: Derl Barrow, Md   Procedure: Venofer infusion    Note: Patient received Venofer 500 mg infusion (dose # 1 of 2) via PIV. No pre-medications ordered. Patient tolerated infusion well with no adverse reaction. Vital signs stable. Printed AVS offered but patient refused. Patient to come back in 1 week for second infusion and will call back to schedule next appointment. Patient alert, oriented and ambulatory at discharge.

## 2022-08-07 DIAGNOSIS — O09523 Supervision of elderly multigravida, third trimester: Secondary | ICD-10-CM | POA: Diagnosis not present

## 2022-08-08 ENCOUNTER — Non-Acute Institutional Stay (HOSPITAL_COMMUNITY)
Admission: RE | Admit: 2022-08-08 | Discharge: 2022-08-08 | Disposition: A | Discharge status: Home / Self Care | Payer: BC Managed Care – PPO | Source: Ambulatory Visit | Attending: Internal Medicine | Admitting: Internal Medicine

## 2022-08-08 DIAGNOSIS — D509 Iron deficiency anemia, unspecified: Secondary | ICD-10-CM | POA: Diagnosis not present

## 2022-08-08 MED ORDER — IRON SUCROSE 500 MG IVPB - SIMPLE MED
500.0000 mg | Freq: Once | INTRAVENOUS | Status: AC
  Administered 2022-08-08: 500 mg via INTRAVENOUS
  Filled 2022-08-08: qty 275

## 2022-08-08 MED ORDER — SODIUM CHLORIDE 0.9 % IV SOLN: INTRAVENOUS | Status: DC | PRN

## 2022-08-08 NOTE — Progress Notes (Signed)
PATIENT CARE CENTER NOTE     Diagnosis: Iron deficiency anemia, unspecified D50.9     Provider: Derl Barrow, Md     Procedure: Venofer infusion      Note: Patient received Venofer 500 mg infusion (dose # 2 of 2) via PIV. No pre-medications ordered. Patient tolerated infusion well with no adverse reaction. Vital signs stable. Printed AVS offered but patient refused. Patient alert, oriented and ambulatory at discharge.

## 2022-08-12 ENCOUNTER — Telehealth (HOSPITAL_COMMUNITY): Payer: Self-pay | Admitting: *Deleted

## 2022-08-13 NOTE — Telephone Encounter (Signed)
Preadmission screen  

## 2022-08-14 ENCOUNTER — Encounter: Payer: Self-pay | Admitting: Oncology

## 2022-08-14 ENCOUNTER — Inpatient Hospital Stay (HOSPITAL_COMMUNITY): Payer: BC Managed Care – PPO

## 2022-08-14 ENCOUNTER — Inpatient Hospital Stay (HOSPITAL_COMMUNITY): Payer: BC Managed Care – PPO | Admitting: Anesthesiology

## 2022-08-14 ENCOUNTER — Other Ambulatory Visit: Payer: Self-pay

## 2022-08-14 ENCOUNTER — Inpatient Hospital Stay (HOSPITAL_COMMUNITY)
Admission: RE | Admit: 2022-08-14 | Discharge: 2022-08-16 | DRG: 807 | Disposition: A | Discharge status: Home / Self Care | Payer: BC Managed Care – PPO | Attending: Obstetrics and Gynecology | Admitting: Obstetrics and Gynecology

## 2022-08-14 ENCOUNTER — Encounter (HOSPITAL_COMMUNITY): Payer: Self-pay | Admitting: Obstetrics and Gynecology

## 2022-08-14 DIAGNOSIS — Z87891 Personal history of nicotine dependence: Secondary | ICD-10-CM | POA: Diagnosis not present

## 2022-08-14 DIAGNOSIS — O99214 Obesity complicating childbirth: Secondary | ICD-10-CM | POA: Diagnosis present

## 2022-08-14 DIAGNOSIS — O99843 Bariatric surgery status complicating pregnancy, third trimester: Secondary | ICD-10-CM | POA: Diagnosis not present

## 2022-08-14 DIAGNOSIS — O9902 Anemia complicating childbirth: Secondary | ICD-10-CM | POA: Diagnosis present

## 2022-08-14 DIAGNOSIS — O26893 Other specified pregnancy related conditions, third trimester: Principal | ICD-10-CM | POA: Diagnosis present

## 2022-08-14 DIAGNOSIS — Z23 Encounter for immunization: Secondary | ICD-10-CM | POA: Diagnosis not present

## 2022-08-14 DIAGNOSIS — O09529 Supervision of elderly multigravida, unspecified trimester: Principal | ICD-10-CM

## 2022-08-14 DIAGNOSIS — O164 Unspecified maternal hypertension, complicating childbirth: Secondary | ICD-10-CM | POA: Diagnosis not present

## 2022-08-14 DIAGNOSIS — Z3A39 39 weeks gestation of pregnancy: Secondary | ICD-10-CM

## 2022-08-14 LAB — CBC
HCT: 35.5 % — ABNORMAL LOW (ref 36.0–46.0)
Hemoglobin: 11.5 g/dL — ABNORMAL LOW (ref 12.0–15.0)
MCH: 28.3 pg (ref 26.0–34.0)
MCHC: 32.4 g/dL (ref 30.0–36.0)
MCV: 87.4 fL (ref 80.0–100.0)
Platelets: 339 10*3/uL (ref 150–400)
RBC: 4.06 MIL/uL (ref 3.87–5.11)
RDW: 15.9 % — ABNORMAL HIGH (ref 11.5–15.5)
WBC: 10.9 10*3/uL — ABNORMAL HIGH (ref 4.0–10.5)
nRBC: 0 % (ref 0.0–0.2)

## 2022-08-14 LAB — TYPE AND SCREEN
ABO/RH(D): B POS
Antibody Screen: NEGATIVE

## 2022-08-14 LAB — HIV ANTIBODY (ROUTINE TESTING W REFLEX): HIV Screen 4th Generation wRfx: NONREACTIVE

## 2022-08-14 LAB — RPR: RPR Ser Ql: NONREACTIVE

## 2022-08-14 MED ORDER — SIMETHICONE 80 MG PO CHEW
80.0000 mg | CHEWABLE_TABLET | ORAL | Status: DC | PRN
  Administered 2022-08-14: 80 mg via ORAL
  Filled 2022-08-14: qty 1

## 2022-08-14 MED ORDER — OXYTOCIN-SODIUM CHLORIDE 30-0.9 UT/500ML-% IV SOLN
2.5000 [IU]/h | INTRAVENOUS | Status: DC
  Filled 2022-08-14: qty 500

## 2022-08-14 MED ORDER — IBUPROFEN 600 MG PO TABS
600.0000 mg | ORAL_TABLET | Freq: Four times a day (QID) | ORAL | Status: DC
  Administered 2022-08-14 – 2022-08-16 (×9): 600 mg via ORAL
  Filled 2022-08-14 (×9): qty 1

## 2022-08-14 MED ORDER — TERBUTALINE SULFATE 1 MG/ML IJ SOLN: 0.2500 mg | Freq: Once | INTRAMUSCULAR | Status: DC | PRN

## 2022-08-14 MED ORDER — OXYCODONE-ACETAMINOPHEN 5-325 MG PO TABS: 2.0000 | ORAL_TABLET | ORAL | Status: DC | PRN

## 2022-08-14 MED ORDER — OXYTOCIN BOLUS FROM INFUSION
333.0000 mL | Freq: Once | INTRAVENOUS | Status: AC
  Administered 2022-08-14: 333 mL via INTRAVENOUS

## 2022-08-14 MED ORDER — ACETAMINOPHEN 325 MG PO TABS: 650.0000 mg | ORAL_TABLET | ORAL | Status: DC | PRN

## 2022-08-14 MED ORDER — OXYTOCIN-SODIUM CHLORIDE 30-0.9 UT/500ML-% IV SOLN: 2.5000 [IU]/h | INTRAVENOUS | Status: DC | PRN

## 2022-08-14 MED ORDER — PHENYLEPHRINE 80 MCG/ML (10ML) SYRINGE FOR IV PUSH (FOR BLOOD PRESSURE SUPPORT)
80.0000 ug | PREFILLED_SYRINGE | INTRAVENOUS | Status: DC | PRN
  Filled 2022-08-14: qty 10

## 2022-08-14 MED ORDER — METOPROLOL SUCCINATE 12.5 MG HALF TABLET
12.5000 mg | ORAL_TABLET | Freq: Every day | ORAL | Status: DC
  Administered 2022-08-14: 12.5 mg via ORAL
  Filled 2022-08-14 (×2): qty 1

## 2022-08-14 MED ORDER — ONDANSETRON HCL 4 MG/2ML IJ SOLN: 4.0000 mg | INTRAMUSCULAR | Status: DC | PRN

## 2022-08-14 MED ORDER — DIPHENHYDRAMINE HCL 25 MG PO CAPS: 25.0000 mg | ORAL_CAPSULE | Freq: Four times a day (QID) | ORAL | Status: DC | PRN

## 2022-08-14 MED ORDER — ACETAMINOPHEN 325 MG PO TABS
650.0000 mg | ORAL_TABLET | ORAL | Status: DC | PRN
  Administered 2022-08-14 – 2022-08-15 (×2): 650 mg via ORAL
  Filled 2022-08-14 (×2): qty 2

## 2022-08-14 MED ORDER — OXYCODONE HCL 5 MG PO TABS: 10.0000 mg | ORAL_TABLET | ORAL | Status: DC | PRN

## 2022-08-14 MED ORDER — METHYLERGONOVINE MALEATE 0.2 MG PO TABS
0.2000 mg | ORAL_TABLET | ORAL | Status: DC | PRN
  Filled 2022-08-14: qty 1

## 2022-08-14 MED ORDER — FENTANYL CITRATE (PF) 100 MCG/2ML IJ SOLN: 50.0000 ug | INTRAMUSCULAR | Status: DC | PRN

## 2022-08-14 MED ORDER — WITCH HAZEL-GLYCERIN EX PADS: 1.0000 | MEDICATED_PAD | CUTANEOUS | Status: DC | PRN

## 2022-08-14 MED ORDER — LIDOCAINE-EPINEPHRINE (PF) 2 %-1:200000 IJ SOLN
INTRAMUSCULAR | Status: DC | PRN
  Administered 2022-08-14: 5 mL via EPIDURAL

## 2022-08-14 MED ORDER — EPHEDRINE 5 MG/ML INJ: 10.0000 mg | INTRAVENOUS | Status: DC | PRN

## 2022-08-14 MED ORDER — ZOLPIDEM TARTRATE 5 MG PO TABS: 5.0000 mg | ORAL_TABLET | Freq: Every evening | ORAL | Status: DC | PRN

## 2022-08-14 MED ORDER — SENNOSIDES-DOCUSATE SODIUM 8.6-50 MG PO TABS
2.0000 | ORAL_TABLET | Freq: Every day | ORAL | Status: DC
  Administered 2022-08-15 – 2022-08-16 (×2): 2 via ORAL
  Filled 2022-08-14 (×2): qty 2

## 2022-08-14 MED ORDER — LACTATED RINGERS IV SOLN: INTRAVENOUS | Status: DC

## 2022-08-14 MED ORDER — DIBUCAINE (PERIANAL) 1 % EX OINT: 1.0000 | TOPICAL_OINTMENT | CUTANEOUS | Status: DC | PRN

## 2022-08-14 MED ORDER — DIPHENHYDRAMINE HCL 50 MG/ML IJ SOLN: 12.5000 mg | INTRAMUSCULAR | Status: DC | PRN

## 2022-08-14 MED ORDER — METHYLERGONOVINE MALEATE 0.2 MG/ML IJ SOLN: 0.2000 mg | INTRAMUSCULAR | Status: DC | PRN

## 2022-08-14 MED ORDER — LIDOCAINE HCL (PF) 1 % IJ SOLN: 30.0000 mL | INTRAMUSCULAR | Status: DC | PRN

## 2022-08-14 MED ORDER — EPHEDRINE 5 MG/ML INJ
10.0000 mg | INTRAVENOUS | Status: DC | PRN
  Filled 2022-08-14: qty 5

## 2022-08-14 MED ORDER — PRENATAL MULTIVITAMIN CH
1.0000 | ORAL_TABLET | Freq: Every day | ORAL | Status: DC
  Administered 2022-08-15 – 2022-08-16 (×2): 1 via ORAL
  Filled 2022-08-14 (×2): qty 1

## 2022-08-14 MED ORDER — TETANUS-DIPHTH-ACELL PERTUSSIS 5-2.5-18.5 LF-MCG/0.5 IM SUSY: 0.5000 mL | PREFILLED_SYRINGE | Freq: Once | INTRAMUSCULAR | Status: DC

## 2022-08-14 MED ORDER — COCONUT OIL OIL
1.0000 | TOPICAL_OIL | Status: DC | PRN
  Administered 2022-08-14: 1 via TOPICAL

## 2022-08-14 MED ORDER — OXYCODONE HCL 5 MG PO TABS: 5.0000 mg | ORAL_TABLET | ORAL | Status: DC | PRN

## 2022-08-14 MED ORDER — LACTATED RINGERS IV SOLN
500.0000 mL | Freq: Once | INTRAVENOUS | Status: AC
  Administered 2022-08-14: 500 mL via INTRAVENOUS

## 2022-08-14 MED ORDER — PHENYLEPHRINE 80 MCG/ML (10ML) SYRINGE FOR IV PUSH (FOR BLOOD PRESSURE SUPPORT)
80.0000 ug | PREFILLED_SYRINGE | INTRAVENOUS | Status: DC | PRN
  Administered 2022-08-14: 80 ug via INTRAVENOUS

## 2022-08-14 MED ORDER — OXYCODONE-ACETAMINOPHEN 5-325 MG PO TABS: 1.0000 | ORAL_TABLET | ORAL | Status: DC | PRN

## 2022-08-14 MED ORDER — LACTATED RINGERS IV SOLN
500.0000 mL | INTRAVENOUS | Status: DC | PRN
  Administered 2022-08-14 (×2): 500 mL via INTRAVENOUS

## 2022-08-14 MED ORDER — FENTANYL-BUPIVACAINE-NACL 0.5-0.125-0.9 MG/250ML-% EP SOLN
12.0000 mL/h | EPIDURAL | Status: DC | PRN
  Administered 2022-08-14: 12 mL/h via EPIDURAL
  Filled 2022-08-14: qty 250

## 2022-08-14 MED ORDER — OXYTOCIN-SODIUM CHLORIDE 30-0.9 UT/500ML-% IV SOLN
1.0000 m[IU]/min | INTRAVENOUS | Status: DC
  Administered 2022-08-14: 2 m[IU]/min via INTRAVENOUS

## 2022-08-14 MED ORDER — BENZOCAINE-MENTHOL 20-0.5 % EX AERO
1.0000 | INHALATION_SPRAY | CUTANEOUS | Status: DC | PRN
  Administered 2022-08-14: 1 via TOPICAL
  Filled 2022-08-14: qty 56

## 2022-08-14 MED ORDER — MISOPROSTOL 25 MCG QUARTER TABLET
25.0000 ug | ORAL_TABLET | ORAL | Status: DC | PRN
  Administered 2022-08-14: 25 ug via VAGINAL
  Filled 2022-08-14: qty 1

## 2022-08-14 MED ORDER — ONDANSETRON HCL 4 MG PO TABS: 4.0000 mg | ORAL_TABLET | ORAL | Status: DC | PRN

## 2022-08-14 MED ORDER — SOD CITRATE-CITRIC ACID 500-334 MG/5ML PO SOLN: 30.0000 mL | ORAL | Status: DC | PRN

## 2022-08-14 MED ORDER — ONDANSETRON HCL 4 MG/2ML IJ SOLN: 4.0000 mg | Freq: Four times a day (QID) | INTRAMUSCULAR | Status: DC | PRN

## 2022-08-14 NOTE — Anesthesia Preprocedure Evaluation (Signed)
Anesthesia Evaluation  Patient identified by MRN, date of birth, ID band Patient awake    Reviewed: Allergy & Precautions, NPO status , Patient's Chart, lab work & pertinent test results, reviewed documented beta blocker date and time   Airway Mallampati: III  TM Distance: >3 FB Neck ROM: Full    Dental no notable dental hx.    Pulmonary neg pulmonary ROS, former smoker   Pulmonary exam normal breath sounds clear to auscultation       Cardiovascular hypertension, Pt. on home beta blockers and Pt. on medications Normal cardiovascular exam+ dysrhythmias Supra Ventricular Tachycardia  Rhythm:Regular Rate:Normal     Neuro/Psych negative neurological ROS  negative psych ROS   GI/Hepatic negative GI ROS, Neg liver ROS,,,  Endo/Other  negative endocrine ROS  Obese BMI 38  Renal/GU negative Renal ROS  negative genitourinary   Musculoskeletal negative musculoskeletal ROS (+)    Abdominal   Peds  Hematology negative hematology ROS (+)   Anesthesia Other Findings IOL for AMA and term  Reproductive/Obstetrics (+) Pregnancy                             Anesthesia Physical Anesthesia Plan  ASA: 3  Anesthesia Plan: Epidural   Post-op Pain Management:    Induction:   PONV Risk Score and Plan: Treatment may vary due to age or medical condition  Airway Management Planned: Natural Airway  Additional Equipment:   Intra-op Plan:   Post-operative Plan:   Informed Consent: I have reviewed the patients History and Physical, chart, labs and discussed the procedure including the risks, benefits and alternatives for the proposed anesthesia with the patient or authorized representative who has indicated his/her understanding and acceptance.       Plan Discussed with: Anesthesiologist  Anesthesia Plan Comments: (Patient identified. Risks, benefits, options discussed with patient including but not  limited to bleeding, infection, nerve damage, paralysis, failed block, incomplete pain control, headache, blood pressure changes, nausea, vomiting, reactions to medication, itching, and post partum back pain. Confirmed with bedside nurse the patient's most recent platelet count. Confirmed with the patient that they are not taking any anticoagulation, have any bleeding history or any family history of bleeding disorders. Patient expressed understanding and wishes to proceed. All questions were answered. )       Anesthesia Quick Evaluation

## 2022-08-14 NOTE — Anesthesia Procedure Notes (Signed)
Epidural Patient location during procedure: OB Start time: 08/14/2022 7:50 AM End time: 08/14/2022 8:00 AM  Staffing Anesthesiologist: Elmer Picker, MD Performed: anesthesiologist   Preanesthetic Checklist Completed: patient identified, IV checked, risks and benefits discussed, monitors and equipment checked, pre-op evaluation and timeout performed  Epidural Patient position: sitting Prep: DuraPrep and site prepped and draped Patient monitoring: continuous pulse ox, blood pressure, heart rate and cardiac monitor Approach: midline Location: L3-L4 Injection technique: LOR air  Needle:  Needle type: Tuohy  Needle gauge: 17 G Needle length: 9 cm Needle insertion depth: 7 cm Catheter type: closed end flexible Catheter size: 19 Gauge Catheter at skin depth: 12 cm Test dose: negative  Assessment Sensory level: T8 Events: blood not aspirated, no cerebrospinal fluid, injection not painful, no injection resistance, no paresthesia and negative IV test  Additional Notes Patient identified. Risks/Benefits/Options discussed with patient including but not limited to bleeding, infection, nerve damage, paralysis, failed block, incomplete pain control, headache, blood pressure changes, nausea, vomiting, reactions to medication both or allergic, itching and postpartum back pain. Confirmed with bedside nurse the patient's most recent platelet count. Confirmed with patient that they are not currently taking any anticoagulation, have any bleeding history or any family history of bleeding disorders. Patient expressed understanding and wished to proceed. All questions were answered. Sterile technique was used throughout the entire procedure. Please see nursing notes for vital signs. Test dose was given through epidural catheter and negative prior to continuing to dose epidural or start infusion. Warning signs of high block given to the patient including shortness of breath, tingling/numbness in hands,  complete motor block, or any concerning symptoms with instructions to call for help. Patient was given instructions on fall risk and not to get out of bed. All questions and concerns addressed with instructions to call with any issues or inadequate analgesia.  Reason for block:procedure for pain

## 2022-08-14 NOTE — H&P (Signed)
Amber Hunt is a 42 y.o. female presenting for advanced maternal age  38 year old G4 P3-0-1-3 at 30+2 presents for induction of labor for advanced maternal age.  Her pregnancy has been complicated by advanced maternal age, anemia, history of hypertension, obesity.  She has a history of a gastric sleeve procedure in 2017.  Since that time she has had persistent anemia.  Prior to her bariatric surgery she had chronic hypertension.  With weight loss she has had resolution of her hypertension and has not required antihypertensive medications during this pregnancy.  She did have some issues with palpitations during this pregnancy and was placed on metoprolol by cardiology.  She has received iron transfusions during this pregnancy OB History     Gravida  4   Para  3   Term  3   Preterm  0   AB  1   Living  3      SAB  1   IAB  0   Ectopic  0   Multiple  0   Live Births  1          Past Medical History:  Diagnosis Date   Anemia    H/O LEEP    Hypertension    pt reports no elevated BP since weight loss following bariatric surgery   Obesity affecting pregnancy    Palpitations    currently wearing heart monitor for recurrent palpatations. applied tuesday and reports no palpatations since tuesday   Past Surgical History:  Procedure Laterality Date   CHOLECYSTECTOMY     DILATION AND EVACUATION N/A 06/03/2014   Procedure: DILATATION AND EVACUATION;  Surgeon: Tereso Newcomer, MD;  Location: [facility];  Service: Gynecology;  Laterality: N/A;   LAPAROSCOPIC GASTRIC SLEEVE RESECTION     2017-then 2019 had a duodenal switch and hernia repair   Family History: family history includes Cancer in her father. Social History:  reports that she quit smoking about 16 years ago. Her smoking use included cigarettes. She does not have any smokeless tobacco history on file. She reports that she does not drink alcohol and does not use drugs.     Maternal Diabetes: No Genetic Screening:  Normal Maternal Ultrasounds/Referrals: Normal Fetal Ultrasounds or other Referrals:  Referred to Materal Fetal Medicine  Maternal Substance Abuse:  No Significant Maternal Medications:  Meds include: Other:  Significant Maternal Lab Results:  metoptololNone Number of Prenatal Visits:greater than 3 verified prenatal visits Other Comments:  None  Review of Systems History Dilation: 10 Effacement (%): 100 Station: -1 Exam by:: Dr. Tenny Craw Blood pressure 126/79, pulse 74, temperature 97.7 F (36.5 C), temperature source Oral, resp. rate 18, height 5\' 5"  (1.651 m), weight 103.6 kg, last menstrual period 11/12/2021, SpO2 100 %, unknown if currently breastfeeding. Exam Physical Exam  Alert and oriented x 3 Gravid, soft Fetal heart rate 140, reactive, cat 1 tracing Prenatal labs: ABO, Rh: --/--/B POS (06/13 0211) Antibody: NEG (06/13 0211) Rubella: Immune (11/10 0000) RPR: NON REACTIVE (06/13 0114)  HBsAg: Negative (11/10 0000)  HIV: Non Reactive (06/13 0114)  GBS: Negative/-- (05/22 0000)  Results for orders placed or performed during the hospital encounter of 08/14/22 (from the past 24 hour(s))  CBC     Status: Abnormal   Collection Time: 08/14/22  1:14 AM  Result Value Ref Range   WBC 10.9 (H) 4.0 - 10.5 K/uL   RBC 4.06 3.87 - 5.11 MIL/uL   Hemoglobin 11.5 (L) 12.0 - 15.0 g/dL   HCT 16.1 (L) 09.6 -  46.0 %   MCV 87.4 80.0 - 100.0 fL   MCH 28.3 26.0 - 34.0 pg   MCHC 32.4 30.0 - 36.0 g/dL   RDW 95.1 (H) 88.4 - 16.6 %   Platelets 339 150 - 400 K/uL   nRBC 0.0 0.0 - 0.2 %  RPR     Status: None   Collection Time: 08/14/22  1:14 AM  Result Value Ref Range   RPR Ser Ql NON REACTIVE NON REACTIVE  HIV Antibody (routine testing w rflx)     Status: None   Collection Time: 08/14/22  1:14 AM  Result Value Ref Range   HIV Screen 4th Generation wRfx Non Reactive Non Reactive  Type and screen     Status: None   Collection Time: 08/14/22  2:11 AM  Result Value Ref Range   ABO/RH(D) B POS     Antibody Screen NEG    Sample Expiration      08/17/2022,2359 Performed at [facility], [address], [place], [place] [address]     Assessment/Plan: 1) admit 2)Misoprostol 25 mcg per vagina for cervical ripening followed by AROM and Pitocin when able 3) epidural on request 4) anticipate SVD   Waynard Reeds 08/14/2022, 2:41 PM

## 2022-08-15 LAB — CBC
HCT: 29.7 % — ABNORMAL LOW (ref 36.0–46.0)
Hemoglobin: 9.6 g/dL — ABNORMAL LOW (ref 12.0–15.0)
MCH: 28.7 pg (ref 26.0–34.0)
MCHC: 32.3 g/dL (ref 30.0–36.0)
MCV: 88.7 fL (ref 80.0–100.0)
Platelets: 303 10*3/uL (ref 150–400)
RBC: 3.35 MIL/uL — ABNORMAL LOW (ref 3.87–5.11)
RDW: 16.3 % — ABNORMAL HIGH (ref 11.5–15.5)
WBC: 13.8 10*3/uL — ABNORMAL HIGH (ref 4.0–10.5)
nRBC: 0 % (ref 0.0–0.2)

## 2022-08-15 MED ORDER — METOPROLOL SUCCINATE 12.5 MG HALF TABLET
12.5000 mg | ORAL_TABLET | Freq: Every day | ORAL | Status: DC
  Administered 2022-08-15 – 2022-08-16 (×2): 12.5 mg via ORAL
  Filled 2022-08-15 (×2): qty 1

## 2022-08-15 NOTE — Progress Notes (Signed)
Post Partum Day 1 Subjective: no complaints, up ad lib, voiding, tolerating PO, + flatus, and lochia mild- moderate. She is bonding well with baby. Working on breastfeeding. She denies palpitations, CP, SOB or lightheadedness. Feels well  Objective: Blood pressure (!) 100/50, pulse 68, temperature 98 F (36.7 C), temperature source Oral, resp. rate 18, height 5\' 5"  (1.651 m), weight 103.6 kg, last menstrual period 11/12/2021, SpO2 99 %, unknown if currently breastfeeding.  Physical Exam:  General: alert, cooperative, and no distress Lochia: appropriate Uterine Fundus: firm DVT Evaluation: No evidence of DVT seen on physical exam.  Recent Labs    08/14/22 0114 08/15/22 0509  HGB 11.5* 9.6*  HCT 35.5* 29.7*    Assessment/Plan: Plan for discharge tomorrow and Breastfeeding Pt may elect for discharge home later today if breastfeeding improves . Plans to meet with lactation consultant later this am   LOS: 1 day   Amber Wendel W Quintara Bost, DO 08/15/2022, 10:20 AM

## 2022-08-15 NOTE — Lactation Note (Signed)
This note was copied from a baby's chart. Lactation Consultation Note  Patient Name: Amber Hunt Today's Date: 08/15/2022 Age:42 hours Reason for consult: Initial assessment;1st time breastfeeding  P3, Mother has been taught hand expression.  RN and family state baby has been sleepy at the breast.  Encouraged unwrapping baby for feedings and call for help with next feeding.  Lactation information sheet given.   Maternal Data Has patient been taught Hand Expression?: Yes Does the patient have breastfeeding experience prior to this delivery?: No  Feeding Mother's Current Feeding Choice: Breast Milk and Formula  Interventions Interventions: Breast feeding basics reviewed;Education;LC Services brochure  Consult Status Consult Status: Follow-up Date: 08/16/22 Follow-up type: In-patient    [name] [facility] 08/15/2022, 9:15 AM

## 2022-08-15 NOTE — Anesthesia Postprocedure Evaluation (Signed)
Anesthesia Post Note  Patient: Amber Hunt  Procedure(s) Performed: AN AD HOC LABOR EPIDURAL     Patient location during evaluation: Mother Baby Anesthesia Type: Epidural Level of consciousness: awake and alert and oriented Pain management: satisfactory to patient Vital Signs Assessment: post-procedure vital signs reviewed and stable Respiratory status: respiratory function stable Cardiovascular status: stable Postop Assessment: no headache, no backache, epidural receding, patient able to bend at knees, no signs of nausea or vomiting, adequate PO intake and able to ambulate Anesthetic complications: no   No notable events documented.  Last Vitals:  Vitals:   08/14/22 2300 08/15/22 0300  BP: 120/68 (!) 100/50  Pulse: 65 68  Resp: 18 18  Temp: 36.7 C 36.7 C  SpO2: 98% 99%    Last Pain:  Vitals:   08/15/22 1126  TempSrc:   PainSc: 0-No pain   Pain Goal: Patients Stated Pain Goal: 0 (08/14/22 0600)                 Amber Hunt

## 2022-08-16 MED ORDER — IBUPROFEN 800 MG PO TABS: 800.0000 mg | ORAL_TABLET | Freq: Three times a day (TID) | ORAL | 1 refills | Status: AC | PRN

## 2022-08-16 NOTE — Discharge Summary (Signed)
Postpartum Discharge Summary  Date of Service updated     Patient Name: Amber Hunt DOB: 01/21/1981 MRN: [id number]  Date of admission: 08/14/2022 Delivery date:08/14/2022  Delivering provider: Waynard Reeds  Date of discharge: 08/16/2022  Admitting diagnosis: Advanced maternal age in multigravida [O09.529] Spontaneous vaginal delivery [O80] Intrauterine pregnancy: [redacted]w[redacted]d     Secondary diagnosis:  Principal Problem:   Advanced maternal age in multigravida Active Problems:   Spontaneous vaginal delivery  Additional problems: chronic anemia    Discharge diagnosis: Term Pregnancy Delivered and Anemia                                              Post partum procedures: none Augmentation: AROM, Pitocin, and Cytotec Complications: None  Hospital course: Induction of Labor With Vaginal Delivery   42 y.o. yo [id number] at [redacted]w[redacted]d was admitted to the hospital 08/14/2022 for induction of labor.  Indication for induction: Favorable cervix at term and AMA.  Patient had an labor course complicated bynone Membrane Rupture Time/Date: 9:33 AM ,08/14/2022   Delivery Method:Vaginal, Spontaneous  Episiotomy: None  Lacerations:  1st degree;Vaginal  Details of delivery can be found in separate delivery note.  Patient had a postpartum course complicated by mild asymptomatic anemia. Patient is discharged home 08/16/22.  Newborn Data: Birth date:08/14/2022  Birth time:12:19 PM  Gender:Female  Living status:Living  Apgars:8 ,9  Weight:3480 g    Physical exam  Vitals:   08/15/22 0300 08/15/22 1515 08/15/22 2116 08/16/22 0655  BP: (!) 100/50 115/67 123/81 102/71  Pulse: 68 62 70 66  Resp: 18 18 18 19   Temp: 98 F (36.7 C) 98.3 F (36.8 C) 98.7 F (37.1 C) 97.8 F (36.6 C)  TempSrc: Oral Oral Oral Oral  SpO2: 99% 100% 100%   Weight:      Height:       General: alert, cooperative, and no distress Lochia: appropriate Uterine Fundus: firm Incision: N/A DVT Evaluation: No evidence of DVT  seen on physical exam. Labs: Lab Results  Component Value Date   WBC 13.8 (H) 08/15/2022   HGB 9.6 (L) 08/15/2022   HCT 29.7 (L) 08/15/2022   MCV 88.7 08/15/2022   PLT 303 08/15/2022      Latest Ref Rng & Units 05/29/2022    9:18 AM  CMP  Glucose 70 - 99 mg/dL 86   BUN 6 - 24 mg/dL 6   Creatinine 1.47 - 8.29 mg/dL 5.62   Sodium 130 - 865 mmol/L 137   Potassium 3.5 - 5.2 mmol/L 3.9   Chloride 96 - 106 mmol/L 104   CO2 20 - 29 mmol/L 21   Calcium 8.7 - 10.2 mg/dL 8.8   Total Protein 6.0 - 8.5 g/dL 6.0   Total Bilirubin 0.0 - 1.2 mg/dL 0.7   Alkaline Phos 44 - 121 IU/L 73   AST 0 - 40 IU/L 12   ALT 0 - 32 IU/L 13    Edinburgh Score:    08/14/2022    2:38 PM  Edinburgh Postnatal Depression Scale Screening Tool  I have been able to laugh and see the funny side of things. 0  I have looked forward with enjoyment to things. 0  I have blamed myself unnecessarily when things went wrong. 0  I have been anxious or worried for no good reason. 1  I have felt scared or panicky  for no good reason. 1  Things have been getting on top of me. 0  I have been so unhappy that I have had difficulty sleeping. 0  I have felt sad or miserable. 0  I have been so unhappy that I have been crying. 1  The thought of harming myself has occurred to me. 0  Edinburgh Postnatal Depression Scale Total 3      After visit meds:  Allergies as of 08/16/2022   No Known Allergies      Medication List     STOP taking these medications    aspirin EC 81 MG tablet       TAKE these medications    ibuprofen 800 MG tablet Commonly known as: ADVIL Take 1 tablet (800 mg total) by mouth every 8 (eight) hours as needed.   metoprolol succinate 25 MG 24 hr tablet Commonly known as: Toprol XL Take 0.5 tablets (12.5 mg total) by mouth daily.         Discharge home in stable condition Infant Feeding: Breast Infant Disposition:home with mother Discharge instruction: per After Visit Summary and  Postpartum booklet. Activity: Advance as tolerated. Pelvic rest for 6 weeks.  Diet: routine diet Anticipated Birth Control: Unsure Postpartum Appointment:6 weeks  Follow up Visit: [facility]   08/16/2022 Carlisle Cater, MD

## 2022-08-16 NOTE — Lactation Note (Signed)
This note was copied from a baby's chart. Lactation Consultation Note  Patient Name: Girl Kametria Goshert [id number] Date: 08/16/2022 Age:42 years Reason for consult: Follow-up assessment;1st time breastfeeding;Term;Hyperbilirubinemia;Infant weight loss  Visited with family of 42 years old FT female; Ms. Pinero is a P3 but this is her first time breastfeeding. She voiced she's been trying latching baby to the breast but that baby prefers the bottle, she said she was planning on pumping and bottle feeding anyway because she's going back to work. Ms. Rineer feels encouraged because her milk is started to come into volume, she has been offering bottles with Similac 22 calorie formula and also her breastmilk; praised her for her efforts. Couplet might be going home tonight. Reviewed d/c education, normal newborn behavior, feeding cues, lactogenesis II, pumping schedule, newborn jaundice and anticipatory guidelines. Encouraged to continue putting baby to breast at least 8 times/24 hours or sooner if feeding cues are are present, Ms. Roston was also encouraged to pump every 3 hours to protect her supply and/or for pumping & bottle feeding purposes. FOB present and supportive. All questions and concerns answered, family to contact [facility] services PRN.  Feeding Mother's Current Feeding Choice: Breast Milk and Formula Nipple Type: Extra Slow Flow  Lactation Tools Discussed/Used Tools: Pump;Flanges Flange Size: 24 Breast pump type: Double-Electric Breast Pump Pump Education: Setup, frequency, and cleaning;Milk Storage Reason for Pumping: hyperbilirubinemia Pumping frequency: every 3 hours Pumped volume: 18 mL  Interventions Interventions: Breast feeding basics reviewed;DEBP;Education  Discharge Discharge Education: Engorgement and breast care;Warning signs for feeding baby Pump: Personal (Spectra)  Consult Status Consult Status: PRN Date: 08/17/22 Follow-up type: Call as needed   Hartley Urton Venetia Constable 08/16/2022, 5:02 PM

## 2022-08-16 NOTE — Progress Notes (Signed)
Post Partum Day  Subjective: no complaints, up ad lib, voiding, tolerating PO, + flatus, and lochia mild- moderate. She is bonding well with baby. Working on breastfeeding. She denies palpitations, CP, SOB or lightheadedness. Feels well  Objective: Blood pressure 102/71, pulse 66, temperature 97.8 F (36.6 C), temperature source Oral, resp. rate 19, height 5\' 5"  (1.651 m), weight 103.6 kg, last menstrual period 11/12/2021, SpO2 100 %, unknown if currently breastfeeding.  Physical Exam:  General: alert, cooperative, and no distress Lochia: appropriate Uterine Fundus: firm DVT Evaluation: No evidence of DVT seen on physical exam.  Recent Labs    08/14/22 0114 08/15/22 0509  HGB 11.5* 9.6*  HCT 35.5* 29.7*     Assessment/Plan: Discharge home and Breastfeeding Anemia asymptomatic at 9.6, advised to continue PNV with iuron at home DC home today  LOS: 2 days   Carlisle Cater, MD 08/16/2022, 11:05 AM

## 2022-08-18 ENCOUNTER — Inpatient Hospital Stay (HOSPITAL_COMMUNITY): Payer: BC Managed Care – PPO

## 2022-08-18 ENCOUNTER — Encounter: Payer: Self-pay | Admitting: Oncology

## 2022-09-01 ENCOUNTER — Telehealth (HOSPITAL_COMMUNITY): Payer: Self-pay

## 2022-09-01 NOTE — Telephone Encounter (Signed)
09/01/2022 1731  Name: Amber Hunt MRN: [id number] DOB: [birth year]  Reason for Call:  Transition of Care Hospital Discharge Call  Contact Status: Patient Contact Status: Message  Language assistant needed: Interpreter Mode: Interpreter Not Needed        Follow-Up Questions:    Edinburgh Postnatal Depression Scale:  In the Past 7 Days:    PHQ2-9 Depression Scale:     Discharge Follow-up:    Post-discharge interventions: NA  Signature  Signe Colt

## 2022-09-05 DIAGNOSIS — K648 Other hemorrhoids: Secondary | ICD-10-CM | POA: Diagnosis not present

## 2022-09-12 DIAGNOSIS — Z1331 Encounter for screening for depression: Secondary | ICD-10-CM | POA: Diagnosis not present

## 2022-10-01 ENCOUNTER — Telehealth: Payer: Self-pay

## 2022-10-01 ENCOUNTER — Ambulatory Visit: Payer: BC Managed Care – PPO | Admitting: Cardiology

## 2022-10-01 NOTE — Telephone Encounter (Signed)
Called pt 3 times. No answer, left message on her voicemail.

## 2022-11-23 ENCOUNTER — Encounter: Payer: Self-pay | Admitting: Oncology

## 2023-02-24 DIAGNOSIS — Z0000 Encounter for general adult medical examination without abnormal findings: Secondary | ICD-10-CM | POA: Diagnosis not present

## 2023-02-24 DIAGNOSIS — Z6837 Body mass index (BMI) 37.0-37.9, adult: Secondary | ICD-10-CM | POA: Diagnosis not present

## 2023-02-24 DIAGNOSIS — E559 Vitamin D deficiency, unspecified: Secondary | ICD-10-CM | POA: Diagnosis not present

## 2023-02-24 DIAGNOSIS — Z1331 Encounter for screening for depression: Secondary | ICD-10-CM | POA: Diagnosis not present

## 2023-03-20 ENCOUNTER — Encounter: Payer: Self-pay | Admitting: Oncology

## 2023-09-15 ENCOUNTER — Other Ambulatory Visit: Payer: Self-pay | Admitting: Obstetrics and Gynecology

## 2023-09-15 DIAGNOSIS — N6321 Unspecified lump in the left breast, upper outer quadrant: Secondary | ICD-10-CM

## 2023-09-23 ENCOUNTER — Encounter: Payer: Self-pay | Admitting: Oncology

## 2023-09-23 ENCOUNTER — Ambulatory Visit
Admission: RE | Admit: 2023-09-23 | Discharge: 2023-09-23 | Disposition: A | Discharge status: Home / Self Care | Source: Ambulatory Visit | Attending: Obstetrics and Gynecology | Admitting: Obstetrics and Gynecology

## 2023-09-23 DIAGNOSIS — N6321 Unspecified lump in the left breast, upper outer quadrant: Secondary | ICD-10-CM
